# Patient Record
Sex: Female | Born: 1980 | Race: White | Hispanic: No | Marital: Married | Smoking: Former smoker
Health system: Southern US, Community
[De-identification: ages and names within clinical notes are randomized; demographics above are authoritative.]

## PROBLEM LIST (undated history)

## (undated) DIAGNOSIS — D649 Anemia, unspecified: Secondary | ICD-10-CM

## (undated) DIAGNOSIS — F419 Anxiety disorder, unspecified: Secondary | ICD-10-CM

## (undated) HISTORY — PX: TONSILLECTOMY: SUR1361

---

## 2014-01-02 ENCOUNTER — Other Ambulatory Visit (HOSPITAL_COMMUNITY)
Admission: RE | Admit: 2014-01-02 | Discharge: 2014-01-02 | Disposition: A | Discharge status: Home / Self Care | Payer: BC Managed Care – PPO | Source: Ambulatory Visit | Attending: Family Medicine | Admitting: Family Medicine

## 2014-01-02 DIAGNOSIS — Z113 Encounter for screening for infections with a predominantly sexual mode of transmission: Secondary | ICD-10-CM | POA: Insufficient documentation

## 2014-01-02 DIAGNOSIS — Z01419 Encounter for gynecological examination (general) (routine) without abnormal findings: Secondary | ICD-10-CM | POA: Insufficient documentation

## 2015-12-10 ENCOUNTER — Other Ambulatory Visit: Payer: Self-pay | Admitting: Family Medicine

## 2015-12-10 DIAGNOSIS — N63 Unspecified lump in unspecified breast: Secondary | ICD-10-CM

## 2015-12-23 ENCOUNTER — Other Ambulatory Visit: Payer: Self-pay

## 2017-01-19 ENCOUNTER — Other Ambulatory Visit: Payer: Self-pay | Admitting: Family Medicine

## 2017-01-19 ENCOUNTER — Other Ambulatory Visit (HOSPITAL_COMMUNITY)
Admission: RE | Admit: 2017-01-19 | Discharge: 2017-01-19 | Disposition: A | Discharge status: Home / Self Care | Payer: BC Managed Care – PPO | Source: Ambulatory Visit | Attending: Family Medicine | Admitting: Family Medicine

## 2017-01-19 DIAGNOSIS — Z124 Encounter for screening for malignant neoplasm of cervix: Secondary | ICD-10-CM | POA: Diagnosis not present

## 2017-01-20 LAB — CYTOLOGY - PAP
ADEQUACY: ABSENT
DIAGNOSIS: NEGATIVE
HPV: NOT DETECTED

## 2018-09-24 ENCOUNTER — Other Ambulatory Visit: Payer: Self-pay | Admitting: *Deleted

## 2018-09-24 DIAGNOSIS — Z20822 Contact with and (suspected) exposure to covid-19: Secondary | ICD-10-CM

## 2018-09-27 NOTE — Addendum Note (Signed)
Addended by: Alija Riano M on: 09/27/2018 03:53 PM   Modules accepted: Orders  

## 2020-10-14 ENCOUNTER — Other Ambulatory Visit: Payer: Self-pay | Admitting: Home Modifications

## 2020-10-14 ENCOUNTER — Ambulatory Visit
Admission: RE | Admit: 2020-10-14 | Discharge: 2020-10-14 | Disposition: A | Discharge status: Home / Self Care | Payer: BC Managed Care – PPO | Source: Ambulatory Visit | Attending: Home Modifications | Admitting: Home Modifications

## 2020-10-14 ENCOUNTER — Other Ambulatory Visit: Payer: Self-pay

## 2020-10-14 DIAGNOSIS — M546 Pain in thoracic spine: Secondary | ICD-10-CM

## 2021-04-16 ENCOUNTER — Other Ambulatory Visit: Payer: Self-pay | Admitting: Family Medicine

## 2021-04-16 DIAGNOSIS — R109 Unspecified abdominal pain: Secondary | ICD-10-CM

## 2021-04-28 ENCOUNTER — Ambulatory Visit
Admission: RE | Admit: 2021-04-28 | Discharge: 2021-04-28 | Disposition: A | Discharge status: Home / Self Care | Payer: BC Managed Care – PPO | Source: Ambulatory Visit | Attending: Family Medicine | Admitting: Family Medicine

## 2021-04-28 DIAGNOSIS — R109 Unspecified abdominal pain: Secondary | ICD-10-CM

## 2021-09-28 LAB — OB RESULTS CONSOLE HEPATITIS B SURFACE ANTIGEN: Hepatitis B Surface Ag: NEGATIVE

## 2021-09-28 LAB — OB RESULTS CONSOLE RUBELLA ANTIBODY, IGM: Rubella: IMMUNE

## 2021-09-28 LAB — HEPATITIS C ANTIBODY: HCV Ab: NEGATIVE

## 2021-09-28 LAB — OB RESULTS CONSOLE HIV ANTIBODY (ROUTINE TESTING): HIV: NONREACTIVE

## 2021-09-30 LAB — OB RESULTS CONSOLE GC/CHLAMYDIA
Chlamydia: NEGATIVE
Neisseria Gonorrhea: NEGATIVE

## 2022-01-31 ENCOUNTER — Other Ambulatory Visit: Payer: Self-pay

## 2022-01-31 ENCOUNTER — Inpatient Hospital Stay (HOSPITAL_COMMUNITY)
Admission: AD | Admit: 2022-01-31 | Discharge: 2022-02-05 | DRG: 833 | Disposition: A | Discharge status: Home / Self Care | Payer: BC Managed Care – PPO | Attending: Obstetrics and Gynecology | Admitting: Obstetrics and Gynecology

## 2022-01-31 ENCOUNTER — Encounter (HOSPITAL_COMMUNITY): Payer: Self-pay | Admitting: *Deleted

## 2022-01-31 DIAGNOSIS — Z3A30 30 weeks gestation of pregnancy: Secondary | ICD-10-CM

## 2022-01-31 DIAGNOSIS — O1403 Mild to moderate pre-eclampsia, third trimester: Principal | ICD-10-CM | POA: Diagnosis not present

## 2022-01-31 DIAGNOSIS — O133 Gestational [pregnancy-induced] hypertension without significant proteinuria, third trimester: Secondary | ICD-10-CM

## 2022-01-31 DIAGNOSIS — Z87891 Personal history of nicotine dependence: Secondary | ICD-10-CM

## 2022-01-31 DIAGNOSIS — O99283 Endocrine, nutritional and metabolic diseases complicating pregnancy, third trimester: Secondary | ICD-10-CM | POA: Diagnosis present

## 2022-01-31 DIAGNOSIS — O1404 Mild to moderate pre-eclampsia, complicating childbirth: Principal | ICD-10-CM | POA: Diagnosis present

## 2022-01-31 DIAGNOSIS — E876 Hypokalemia: Secondary | ICD-10-CM | POA: Diagnosis present

## 2022-01-31 DIAGNOSIS — O09513 Supervision of elderly primigravida, third trimester: Secondary | ICD-10-CM

## 2022-01-31 HISTORY — DX: Anxiety disorder, unspecified: F41.9

## 2022-01-31 HISTORY — DX: Anemia, unspecified: D64.9

## 2022-01-31 LAB — COMPREHENSIVE METABOLIC PANEL
ALT: 15 U/L (ref 0–44)
AST: 29 U/L (ref 15–41)
Albumin: 2.5 g/dL — ABNORMAL LOW (ref 3.5–5.0)
Alkaline Phosphatase: 76 U/L (ref 38–126)
Anion gap: 14 (ref 5–15)
BUN: <5 mg/dL — ABNORMAL LOW (ref 6–20)
CO2: 26 mmol/L (ref 22–32)
Calcium: 8.9 mg/dL (ref 8.9–10.3)
Chloride: 99 mmol/L (ref 98–111)
Creatinine, Ser: 0.59 mg/dL (ref 0.44–1.00)
GFR, Estimated: >60 mL/min (ref >60)
Glucose, Bld: 151 mg/dL — ABNORMAL HIGH (ref 70–99)
Potassium: 2.2 mmol/L — CL (ref 3.5–5.1)
Sodium: 139 mmol/L (ref 135–145)
Total Bilirubin: 0.4 mg/dL (ref 0.3–1.2)
Total Protein: 5.5 g/dL — ABNORMAL LOW (ref 6.5–8.1)

## 2022-01-31 LAB — PROTEIN / CREATININE RATIO, URINE
Creatinine, Urine: 29 mg/dL
Protein Creatinine Ratio: 0.24 mg/mg{Cre} — ABNORMAL HIGH (ref 0.00–0.15)
Total Protein, Urine: 7 mg/dL

## 2022-01-31 LAB — MAGNESIUM: Magnesium: 1.5 mg/dL — ABNORMAL LOW (ref 1.7–2.4)

## 2022-01-31 LAB — CBC
HCT: 29.2 % — ABNORMAL LOW (ref 36.0–46.0)
Hemoglobin: 10.5 g/dL — ABNORMAL LOW (ref 12.0–15.0)
MCH: 30.3 pg (ref 26.0–34.0)
MCHC: 36 g/dL (ref 30.0–36.0)
MCV: 84.4 fL (ref 80.0–100.0)
Platelets: 249 10*3/uL (ref 150–400)
RBC: 3.46 MIL/uL — ABNORMAL LOW (ref 3.87–5.11)
RDW: 14.2 % (ref 11.5–15.5)
WBC: 8.5 10*3/uL (ref 4.0–10.5)
nRBC: 0 % (ref 0.0–0.2)

## 2022-01-31 LAB — URINALYSIS, ROUTINE W REFLEX MICROSCOPIC
Bilirubin Urine: NEGATIVE
Glucose, UA: NEGATIVE mg/dL
Hgb urine dipstick: NEGATIVE
Ketones, ur: 20 mg/dL — AB
Leukocytes,Ua: NEGATIVE
Nitrite: NEGATIVE
Protein, ur: NEGATIVE mg/dL
Specific Gravity, Urine: 1.004 — ABNORMAL LOW (ref 1.005–1.030)
pH: 6 (ref 5.0–8.0)

## 2022-01-31 LAB — POTASSIUM: Potassium: 2 mmol/L — CL (ref 3.5–5.1)

## 2022-01-31 MED ORDER — VITAMIN D 25 MCG (1000 UNIT) PO TABS
3000.0000 [IU] | ORAL_TABLET | Freq: Every day | ORAL | Status: DC
  Administered 2022-02-01 – 2022-02-05 (×5): 3000 [IU] via ORAL
  Filled 2022-01-31 (×5): qty 3

## 2022-01-31 MED ORDER — ACETAMINOPHEN 325 MG PO TABS
650.0000 mg | ORAL_TABLET | ORAL | Status: DC | PRN
  Administered 2022-02-02: 650 mg via ORAL
  Filled 2022-01-31: qty 2

## 2022-01-31 MED ORDER — CITALOPRAM HYDROBROMIDE 20 MG PO TABS
20.0000 mg | ORAL_TABLET | Freq: Every day | ORAL | Status: DC
  Administered 2022-01-31 – 2022-02-04 (×5): 20 mg via ORAL
  Filled 2022-01-31 (×6): qty 1

## 2022-01-31 MED ORDER — SODIUM CHLORIDE 0.9 % IV SOLN: Freq: Once | INTRAVENOUS | Status: AC

## 2022-01-31 MED ORDER — ACETAMINOPHEN-CAFFEINE 500-65 MG PO TABS
2.0000 | ORAL_TABLET | Freq: Once | ORAL | Status: AC
  Administered 2022-01-31: 2 via ORAL
  Filled 2022-01-31: qty 2

## 2022-01-31 MED ORDER — ASPIRIN 81 MG PO TBEC
81.0000 mg | DELAYED_RELEASE_TABLET | Freq: Every day | ORAL | Status: DC
  Administered 2022-02-01 – 2022-02-05 (×5): 81 mg via ORAL
  Filled 2022-01-31 (×6): qty 1

## 2022-01-31 MED ORDER — FERROUS SULFATE 324 MG PO TBEC: 324.0000 mg | DELAYED_RELEASE_TABLET | Freq: Every day | ORAL | Status: DC

## 2022-01-31 MED ORDER — PRENATAL MULTIVITAMIN CH
1.0000 | ORAL_TABLET | Freq: Every day | ORAL | Status: DC
  Administered 2022-02-01 – 2022-02-04 (×4): 1 via ORAL
  Filled 2022-01-31 (×5): qty 1

## 2022-01-31 MED ORDER — POTASSIUM CHLORIDE 10 MEQ/100ML IV SOLN
10.0000 meq | INTRAVENOUS | Status: AC
  Administered 2022-01-31 (×2): 10 meq via INTRAVENOUS
  Filled 2022-01-31 (×2): qty 100

## 2022-01-31 MED ORDER — POTASSIUM CHLORIDE CRYS ER 20 MEQ PO TBCR
40.0000 meq | EXTENDED_RELEASE_TABLET | Freq: Once | ORAL | Status: AC
  Administered 2022-01-31: 40 meq via ORAL
  Filled 2022-01-31: qty 2

## 2022-01-31 MED ORDER — POTASSIUM CHLORIDE 10 MEQ/100ML IV SOLN
10.0000 meq | INTRAVENOUS | Status: AC
  Administered 2022-01-31 – 2022-02-01 (×2): 10 meq via INTRAVENOUS
  Filled 2022-01-31 (×2): qty 100

## 2022-01-31 MED ORDER — CALCIUM CARBONATE ANTACID 500 MG PO CHEW: 2.0000 | CHEWABLE_TABLET | ORAL | Status: DC | PRN

## 2022-01-31 MED ORDER — POTASSIUM CHLORIDE 10 MEQ/100ML IV SOLN: 10.0000 meq | INTRAVENOUS | Status: DC

## 2022-01-31 MED ORDER — MAGNESIUM SULFATE 2 GM/50ML IV SOLN
2.0000 g | Freq: Once | INTRAVENOUS | Status: AC
  Administered 2022-01-31: 2 g via INTRAVENOUS
  Filled 2022-01-31: qty 50

## 2022-01-31 NOTE — MAU Note (Signed)
Sabrina Lindsey is a 41 y.o. at Unknown here in MAU reporting: she was instructed by OB to be seen for BP evaluation.  States randomly checked BP yesterday because BP was elevated @ last office visit. Reports was elevated today while @ work and has slight H/A.  Denies visual disturbances & epigastric pain.   Endorses +FM, denies VB or LOF. LMP: N/A Onset of complaint: yesterday Pain score: 2 Vitals:   01/31/22 1538  BP: 133/86  Pulse: 95  Resp: 20  Temp: 98 F (36.7 C)  SpO2: 98%     FHT:130's Lab orders placed from triage:   UA

## 2022-01-31 NOTE — MAU Provider Note (Signed)
History     CSN: [id number]  Arrival date and time: 01/31/22 1455   Event Date/Time   First Provider Initiated Contact with Patient 01/31/22 1615      Chief Complaint  Patient presents with   BP Evaluation   HPI Sabrina Lindsey is a 41 y.o. G1P0 at [redacted]w[redacted]d who presents to MAU as advised by her [facility] Provider for evaluation of elevated blood pressures on her home cuff. Patient denies history of HTN. She reports blood pressure readings of 130s/80s or lower in the office. She denies visual disturbances, RUQ/epigastric pain, new onset swelling or weight gain.  Patient also c/o headache. Pain score is 2-3/10 on arrival to MAU. She denies history of headaches or migraines. She has not taken medication for this complaint.  Patient denies history of Hypkalemia. She denies vomiting, diarrhea, cramping, weakness and activity intolerance.  Patient receives prenatal care with [facility]. Her next appointment is tomorrow 02/01/2022  OB History     Gravida  1   Para      Term      Preterm      AB      Living         SAB      IAB      Ectopic      Multiple      Live Births              Past Medical History:  Diagnosis Date   Anemia    Anxiety     Past Surgical History:  Procedure Laterality Date   TONSILLECTOMY      Family History  Problem Relation Age of Onset   Hypertension Mother    Healthy Father    Hypertension Brother    Bell's palsy Brother     Social History   Tobacco Use   Smoking status: Former    Types: Cigarettes   Smokeless tobacco: Former  Building services engineer Use: Never used  Substance Use Topics   Alcohol use: Not Currently    Comment: Occasional   Drug use: Never    Allergies: No Known Allergies  Medications Prior to Admission  Medication Sig Dispense Refill Last Dose   aspirin EC 81 MG tablet Take 81 mg by mouth daily. Swallow whole.   01/31/2022 at 0715   cholecalciferol (VITAMIN D3) 25 MCG (1000 UNIT) tablet Take 3,000 Units by  mouth daily.   01/31/2022 at 0715   citalopram (CELEXA) 20 MG tablet Take 20 mg by mouth daily.   01/30/2022 at 200   Doxylamine-Pyridoxine 10-10 MG TBEC Take by mouth.   01/30/2022 at 2200   ferrous sulfate 324 MG TBEC Take 324 mg by mouth.   01/31/2022 at 0715   Prenatal Vit-Fe Fumarate-FA (MULTIVITAMIN-PRENATAL) 27-0.8 MG TABS tablet Take 1 tablet by mouth daily at 12 noon.   01/31/2022 at 0715    Review of Systems  Neurological:  Positive for headaches.       Pain score 2-3/10 on arrival to MAU  All other systems reviewed and are negative.  Physical Exam   Blood pressure (!) 140/87, pulse 90, temperature 98 F (36.7 C), temperature source Oral, resp. rate 20, height 5\' 3"  (1.6 m), weight 79 kg, SpO2 97 %.  Physical Exam Vitals and nursing note reviewed. Exam conducted with a chaperone present.  Constitutional:      Appearance: Normal appearance. She is not ill-appearing.  Cardiovascular:     Rate and Rhythm: Normal rate and regular rhythm.  Pulses: Normal pulses.     Heart sounds: Normal heart sounds.  Pulmonary:     Effort: Pulmonary effort is normal.     Breath sounds: Normal breath sounds.  Abdominal:     Comments: Gravid  Skin:    Capillary Refill: Capillary refill takes less than 2 seconds.  Neurological:     Mental Status: She is alert and oriented to person, place, and time.  Psychiatric:        Mood and Affect: Mood normal.        Behavior: Behavior normal.        Thought Content: Thought content normal.        Judgment: Judgment normal.     MAU Course  Procedures  MDM  --Reactive tracing: baseline 135, mod var, + accels, no decels --Toco: rare UI --Initial K+ critically low on CMET. Patient without associated complaints. Discussed with Dr. Nehemiah Settle. Will plan for PO and IV repletion while also having K+ redrawn. --QT prolongation on ECG, reviewed by Dr. Nehemiah Settle --Repeat K+ remains critically low. Discussed plan for 23 hour obs on OBSC to allow for IV  Potassium and  Magnesium infusions. Patient tearful but agrees with recommendation. Discussed with Dr. Mancel Bale, who agrees with recommendation  Patient Vitals for the past 24 hrs:  BP Temp Temp src Pulse Resp SpO2 Height Weight  01/31/22 1620 (!) 140/87 -- -- 90 -- -- -- --  01/31/22 1605 -- -- -- -- -- 98 % -- --  01/31/22 1600 122/86 -- -- 89 -- 97 % -- --  01/31/22 1553 135/88 -- -- 93 -- -- -- --  01/31/22 1540 133/86 -- -- 90 -- 98 % -- --  01/31/22 1538 133/86 98 F (36.7 C) Oral 95 20 98 % -- --  01/31/22 1520 -- -- -- -- -- -- 5\' 3"  (1.6 m) 79 kg   Results for orders placed or performed during the hospital encounter of 01/31/22 (from the past 24 hour(s))  Protein / creatinine ratio, urine     Status: Abnormal   Collection Time: 01/31/22  3:24 PM  Result Value Ref Range   Creatinine, Urine 29 mg/dL   Total Protein, Urine 7 mg/dL   Protein Creatinine Ratio 0.24 (H) 0.00 - 0.15 mg/mg[Cre]  CBC     Status: Abnormal   Collection Time: 01/31/22  3:25 PM  Result Value Ref Range   WBC 8.5 4.0 - 10.5 K/uL   RBC 3.46 (L) 3.87 - 5.11 MIL/uL   Hemoglobin 10.5 (L) 12.0 - 15.0 g/dL   HCT 29.2 (L) 36.0 - 46.0 %   MCV 84.4 80.0 - 100.0 fL   MCH 30.3 26.0 - 34.0 pg   MCHC 36.0 30.0 - 36.0 g/dL   RDW 14.2 11.5 - 15.5 %   Platelets 249 150 - 400 K/uL   nRBC 0.0 0.0 - 0.2 %  Comprehensive metabolic panel     Status: Abnormal   Collection Time: 01/31/22  3:25 PM  Result Value Ref Range   Sodium 139 135 - 145 mmol/L   Potassium 2.2 (LL) 3.5 - 5.1 mmol/L   Chloride 99 98 - 111 mmol/L   CO2 26 22 - 32 mmol/L   Glucose, Bld 151 (H) 70 - 99 mg/dL   BUN <5 (L) 6 - 20 mg/dL   Creatinine, Ser 0.59 0.44 - 1.00 mg/dL   Calcium 8.9 8.9 - 10.3 mg/dL   Total Protein 5.5 (L) 6.5 - 8.1 g/dL   Albumin 2.5 (L) 3.5 -  5.0 g/dL   AST 29 15 - 41 U/L   ALT 15 0 - 44 U/L   Alkaline Phosphatase 76 38 - 126 U/L   Total Bilirubin 0.4 0.3 - 1.2 mg/dL   GFR, Estimated >60 >60 mL/min   Anion gap 14 5 - 15   Urinalysis, Routine w reflex microscopic Urine, Clean Catch     Status: Abnormal   Collection Time: 01/31/22  3:26 PM  Result Value Ref Range   Color, Urine YELLOW YELLOW   APPearance HAZY (A) CLEAR   Specific Gravity, Urine 1.004 (L) 1.005 - 1.030   pH 6.0 5.0 - 8.0   Glucose, UA NEGATIVE NEGATIVE mg/dL   Hgb urine dipstick NEGATIVE NEGATIVE   Bilirubin Urine NEGATIVE NEGATIVE   Ketones, ur 20 (A) NEGATIVE mg/dL   Protein, ur NEGATIVE NEGATIVE mg/dL   Nitrite NEGATIVE NEGATIVE   Leukocytes,Ua NEGATIVE NEGATIVE  Potassium     Status: Abnormal   Collection Time: 01/31/22  4:28 PM  Result Value Ref Range   Potassium 2.0 (LL) 3.5 - 5.1 mmol/L  Magnesium     Status: Abnormal   Collection Time: 01/31/22  4:28 PM  Result Value Ref Range   Magnesium 1.5 (L) 1.7 - 2.4 mg/dL   Meds ordered this encounter  Medications   acetaminophen-caffeine (EXCEDRIN TENSION HEADACHE) 500-65 MG per tablet 2 tablet   potassium chloride 10 mEq in 100 mL IVPB   potassium chloride SA (KLOR-CON M) CR tablet 40 mEq   0.9 %  sodium chloride infusion   potassium chloride 10 mEq in 100 mL IVPB   magnesium sulfate IVPB 2 g 50 mL    Magnesium level 1.5 in MAU. NOT being given for Preeclampsia   0.9 %  sodium chloride infusion    Assessment and Plan  --41 y.o. G1P0 at [redacted]w[redacted]d  --Reactive tracing --Hypokalemia (2.2, 2.0 on recheck) --Elevated BP x 1, PEC labs WNL --Headache resolved with PO medication --Per Dr. Mancel Bale, transfer to [facility] for Potassium and Magnesium repletion  Darlina Rumpf, [place], MSN, CNM 01/31/2022, 6:27 PM

## 2022-01-31 NOTE — H&P (Addendum)
Sabrina Lindsey is a 41 y.o. female presenting for elevated BP at home.  Denies HA, visual changes or abdominal pain. OB History     Gravida  1   Para      Term      Preterm      AB      Living         SAB      IAB      Ectopic      Multiple      Live Births             Past Medical History:  Diagnosis Date   Anemia    Anxiety    Past Surgical History:  Procedure Laterality Date   TONSILLECTOMY     Family History: family history includes Bell's palsy in her brother; Healthy in her father; Hypertension in her brother and mother. Social History:  reports that she has quit smoking. Her smoking use included cigarettes. She has quit using smokeless tobacco. She reports that she does not currently use alcohol. She reports that she does not use drugs.     Maternal Diabetes: No Genetic Screening: Normal Maternal Ultrasounds/Referrals: Normal Fetal Ultrasounds or other Referrals:  None Maternal Substance Abuse:  No Significant Maternal Medications:  Meds include: Other: celexa Significant Maternal Lab Results:  None Number of Prenatal Visits:greater than 3 verified prenatal visits Other Comments:  None  Review of Systems Denies F/C/N/V/D History   Blood pressure (!) 143/91, pulse 83, temperature 97.8 F (36.6 C), temperature source Oral, resp. rate 19, height 5\' 3"  (1.6 m), weight 79 kg, SpO2 98 %. Exam Physical Exam  Prenatal labs: ABO, Rh:  O + Antibody:  Neg Rubella:  Immune RPR:   NR HBsAg:   Neg HIV:   NR GBS:    Cat 1 tracing  Assessment/Plan: G1 P0 at 30 4/7 wks presenting with mildly elevated BP and hypokalemia and hypomagnesemia.  She likely meets criteria for [facility] given her elevated BP at home.  PCR 0.24.   Will replete potassium and magnesium Restart celexa  Low lying placenta earlier in the pregnancy that has since resolved.   Delice Lesch 01/31/2022, 6:53 PM

## 2022-01-31 NOTE — Progress Notes (Signed)
Date and time results received: 01/31/22 1730 (use smartphrase ".now" to insert current time)  Test: Potassium Critical Value: 2.0  Name of Provider Notified: S. Jeronimo Greaves, CNM  Orders Received? Or Actions Taken?: Orders Received - See Orders for details

## 2022-01-31 NOTE — Progress Notes (Signed)
Date and time results received: 01/31/22 1605 (use smartphrase ".now" to insert current time)  Test: Potassium Critical Value: 2.2  Name of Provider Notified: S. Jeronimo Greaves, CNM  Orders Received? Or Actions Taken?:     waiting for further orders

## 2022-02-01 DIAGNOSIS — O99213 Obesity complicating pregnancy, third trimester: Secondary | ICD-10-CM | POA: Diagnosis not present

## 2022-02-01 DIAGNOSIS — E876 Hypokalemia: Secondary | ICD-10-CM | POA: Diagnosis not present

## 2022-02-01 DIAGNOSIS — Z87891 Personal history of nicotine dependence: Secondary | ICD-10-CM | POA: Diagnosis not present

## 2022-02-01 DIAGNOSIS — Z3A30 30 weeks gestation of pregnancy: Secondary | ICD-10-CM | POA: Diagnosis not present

## 2022-02-01 DIAGNOSIS — O133 Gestational [pregnancy-induced] hypertension without significant proteinuria, third trimester: Secondary | ICD-10-CM | POA: Diagnosis not present

## 2022-02-01 DIAGNOSIS — O1403 Mild to moderate pre-eclampsia, third trimester: Secondary | ICD-10-CM | POA: Diagnosis present

## 2022-02-01 DIAGNOSIS — O09513 Supervision of elderly primigravida, third trimester: Secondary | ICD-10-CM | POA: Diagnosis not present

## 2022-02-01 DIAGNOSIS — R030 Elevated blood-pressure reading, without diagnosis of hypertension: Secondary | ICD-10-CM | POA: Diagnosis present

## 2022-02-01 DIAGNOSIS — O99283 Endocrine, nutritional and metabolic diseases complicating pregnancy, third trimester: Secondary | ICD-10-CM | POA: Diagnosis not present

## 2022-02-01 LAB — TYPE AND SCREEN
ABO/RH(D): O POS
Antibody Screen: NEGATIVE

## 2022-02-01 LAB — POTASSIUM
Potassium: 2.5 mmol/L — CL (ref 3.5–5.1)
Potassium: 2.5 mmol/L — CL (ref 3.5–5.1)

## 2022-02-01 LAB — MAGNESIUM: Magnesium: 1.8 mg/dL (ref 1.7–2.4)

## 2022-02-01 MED ORDER — POTASSIUM CHLORIDE CRYS ER 20 MEQ PO TBCR
40.0000 meq | EXTENDED_RELEASE_TABLET | Freq: Two times a day (BID) | ORAL | Status: DC
  Administered 2022-02-01: 40 meq via ORAL
  Filled 2022-02-01: qty 2

## 2022-02-01 MED ORDER — NIFEDIPINE ER OSMOTIC RELEASE 60 MG PO TB24
60.0000 mg | ORAL_TABLET | Freq: Every day | ORAL | Status: DC
  Administered 2022-02-02: 60 mg via ORAL
  Filled 2022-02-01: qty 1

## 2022-02-01 MED ORDER — POTASSIUM CHLORIDE 10 MEQ/100ML IV SOLN
10.0000 meq | INTRAVENOUS | Status: AC
  Administered 2022-02-02 (×4): 10 meq via INTRAVENOUS
  Filled 2022-02-01 (×4): qty 100

## 2022-02-01 MED ORDER — NIFEDIPINE ER OSMOTIC RELEASE 30 MG PO TB24
30.0000 mg | ORAL_TABLET | Freq: Every day | ORAL | Status: DC
  Administered 2022-02-01: 30 mg via ORAL
  Filled 2022-02-01: qty 1

## 2022-02-01 MED ORDER — POTASSIUM CHLORIDE 10 MEQ/100ML IV SOLN
10.0000 meq | INTRAVENOUS | Status: AC
  Administered 2022-02-01 (×3): 10 meq via INTRAVENOUS
  Filled 2022-02-01 (×3): qty 100

## 2022-02-01 MED ORDER — NIFEDIPINE ER OSMOTIC RELEASE 30 MG PO TB24
30.0000 mg | ORAL_TABLET | Freq: Once | ORAL | Status: AC
  Administered 2022-02-01: 30 mg via ORAL
  Filled 2022-02-01: qty 1

## 2022-02-01 MED ORDER — POTASSIUM CHLORIDE CRYS ER 20 MEQ PO TBCR
40.0000 meq | EXTENDED_RELEASE_TABLET | Freq: Three times a day (TID) | ORAL | Status: DC
  Administered 2022-02-01 – 2022-02-05 (×11): 40 meq via ORAL
  Filled 2022-02-01 (×11): qty 2

## 2022-02-01 NOTE — Progress Notes (Signed)
RN received call from Dr. Alesia Richards regarding latest BP of 157/96. New orders/ changes to orders received. Additional Procardia 30mg  dose to be given now. Patient now inpatient status.

## 2022-02-01 NOTE — Progress Notes (Addendum)
Sabrina Lindsey is a 41 y.o. G1P0 at [redacted]w[redacted]d admitted for headache and found to have gestational hypertension, hypokalemia and hypomagnesemia,   Subjective: Patient reports doing better.  Her headache has gone away.  She denies any chest pain, shortness of breath, nausea or vomiting.   Objective: BP (!) 145/93 (BP Location: Right Arm)   Pulse 77   Temp 97.8 F (36.6 C) (Oral)   Resp 16   Ht 5\' 3"  (1.6 m)   Wt 79 kg   SpO2 97%   BMI 30.86 kg/m  I/O last 3 completed shifts: In: 589.5 [I.V.:589.5] Out: 2150 [Urine:2150] Total I/O In: -  Out: 500 [Urine:500] General: Appears well, no acute distress. CVS: s1, s2, regular rate and rhythm. Pulmonary: Clear to auscultation bilaterally. Abdomen: Soft, non tender, gravid. Extremities: Warm and well perfused, no edema, no calf tenderness. 1+ patellar reflex bilaterally.  FHT:  FHR: 140 bpm, variability: moderate,  accelerations:  Present,  decelerations:  Absent UC:   none SVE:    deferred.    Labs: Lab Results  Component Value Date   WBC 8.5 01/31/2022   HGB 10.5 (L) 01/31/2022   HCT 29.2 (L) 01/31/2022   MCV 84.4 01/31/2022   PLT 249 01/31/2022     Assessment / Plan:  41 y.o. G1P0 at [redacted]w[redacted]d admitted for headache and found to have gestational hypertension, hypokalemia and hypomagnesemia,  - Resolved hypomagnesemia after repletion.  - Will give oral and IV potassium repletion and check potassium level  after repletion.  - Procardia XL started for elevated blood pressures.   Archie Endo, MD 02/01/2022, 11:11 AM

## 2022-02-02 ENCOUNTER — Inpatient Hospital Stay (HOSPITAL_BASED_OUTPATIENT_CLINIC_OR_DEPARTMENT_OTHER): Payer: BC Managed Care – PPO

## 2022-02-02 DIAGNOSIS — O133 Gestational [pregnancy-induced] hypertension without significant proteinuria, third trimester: Secondary | ICD-10-CM

## 2022-02-02 DIAGNOSIS — Z3A30 30 weeks gestation of pregnancy: Secondary | ICD-10-CM

## 2022-02-02 DIAGNOSIS — O99213 Obesity complicating pregnancy, third trimester: Secondary | ICD-10-CM

## 2022-02-02 DIAGNOSIS — O09513 Supervision of elderly primigravida, third trimester: Secondary | ICD-10-CM

## 2022-02-02 DIAGNOSIS — E669 Obesity, unspecified: Secondary | ICD-10-CM

## 2022-02-02 DIAGNOSIS — O99283 Endocrine, nutritional and metabolic diseases complicating pregnancy, third trimester: Secondary | ICD-10-CM

## 2022-02-02 DIAGNOSIS — E876 Hypokalemia: Secondary | ICD-10-CM

## 2022-02-02 LAB — CBC
HCT: 32.6 % — ABNORMAL LOW (ref 36.0–46.0)
Hemoglobin: 11.4 g/dL — ABNORMAL LOW (ref 12.0–15.0)
MCH: 30.4 pg (ref 26.0–34.0)
MCHC: 35 g/dL (ref 30.0–36.0)
MCV: 86.9 fL (ref 80.0–100.0)
Platelets: 263 10*3/uL (ref 150–400)
RBC: 3.75 MIL/uL — ABNORMAL LOW (ref 3.87–5.11)
RDW: 14.7 % (ref 11.5–15.5)
WBC: 10 10*3/uL (ref 4.0–10.5)
nRBC: 0 % (ref 0.0–0.2)

## 2022-02-02 LAB — COMPREHENSIVE METABOLIC PANEL
ALT: 13 U/L (ref 0–44)
AST: 35 U/L (ref 15–41)
Albumin: 2.6 g/dL — ABNORMAL LOW (ref 3.5–5.0)
Alkaline Phosphatase: 82 U/L (ref 38–126)
Anion gap: 14 (ref 5–15)
BUN: <5 mg/dL — ABNORMAL LOW (ref 6–20)
CO2: 24 mmol/L (ref 22–32)
Calcium: 8.4 mg/dL — ABNORMAL LOW (ref 8.9–10.3)
Chloride: 102 mmol/L (ref 98–111)
Creatinine, Ser: 0.52 mg/dL (ref 0.44–1.00)
GFR, Estimated: >60 mL/min (ref >60)
Glucose, Bld: 99 mg/dL (ref 70–99)
Potassium: 3.4 mmol/L — ABNORMAL LOW (ref 3.5–5.1)
Sodium: 140 mmol/L (ref 135–145)
Total Bilirubin: 1.1 mg/dL (ref 0.3–1.2)
Total Protein: 5.6 g/dL — ABNORMAL LOW (ref 6.5–8.1)

## 2022-02-02 LAB — PROTEIN / CREATININE RATIO, URINE
Creatinine, Urine: 91 mg/dL
Protein Creatinine Ratio: 0.3 mg/mg{Cre} — ABNORMAL HIGH (ref 0.00–0.15)
Total Protein, Urine: 27 mg/dL

## 2022-02-02 LAB — GROUP B STREP BY PCR: Group B strep by PCR: NEGATIVE

## 2022-02-02 MED ORDER — DOXYLAMINE-PYRIDOXINE 10-10 MG PO TBEC: DELAYED_RELEASE_TABLET | Freq: Every day | ORAL | Status: DC

## 2022-02-02 MED ORDER — VITAMIN B-6 25 MG PO TABS
25.0000 mg | ORAL_TABLET | Freq: Every day | ORAL | Status: DC
  Administered 2022-02-02 – 2022-02-04 (×3): 25 mg via ORAL
  Filled 2022-02-02 (×3): qty 1

## 2022-02-02 MED ORDER — DOXYLAMINE SUCCINATE (SLEEP) 25 MG PO TABS
25.0000 mg | ORAL_TABLET | Freq: Every day | ORAL | Status: DC
  Administered 2022-02-02 – 2022-02-04 (×3): 25 mg via ORAL
  Filled 2022-02-02 (×3): qty 1

## 2022-02-02 NOTE — Consult Note (Addendum)
MFM Consult Note Patient Name: Sabrina Lindsey  Patient MRN:   [id number]  Referring provider: Osborn Coho, MD / Verlan Friends, MD Reason for Consult: Elevated blood pressure in pregnancy and headache  HPI: Sabrina Lindsey is a 41 y.o. G1P0 at [redacted]w[redacted]d admitted for elevated blood pressure in pregnancy.  The patient presented to the MAU for a headache and was found to have elevated blood pressures.  She did not have any severe range blood pressures but they were very close (upper 150s over 90s).  She was also found to have hypomagnesemia and hypokalemia.  She reports that she has not had nausea vomiting until she was admitted.  She has had electrolyte replacement.  She was started on Procardia XL for elevated blood pressure.  I discussed the diagnosis of gestational hypertension versus preeclampsia.  Also discussed the clinical course and management of each condition.  I discussed that we could potentially discontinue her antihypertensive medications, perform a 24-hour urine protein and continue to assess her blood pressure without medication. I discussed that if she has a persistent headache despite medication then she likely has preeclampsia.  Also discussed that we could consider discontinuing her antihypertensive medications to see if her blood pressure is severe.  If her blood pressure is severe then she would rule in for preeclampsia and then we could treat her blood pressure with the goal of getting her to 34 weeks.  Also discussed indications for delivery including worsening maternal or fetal status.   Review of Systems: A review of systems was performed and was negative except per HPI   Vitals and Physical Exam See intake sheet for vitals Sitting comfortably on the sonogram table Nonlabored breathing Normal rate and rhythm Abdomen is nontender  Genetic testing: Normal and IPS per the patient  Sonographic findings Single intrauterine pregnancy. Observed fetal cardiac activity. Cephalic  presentation. Fetal anatomy that was well seen appears normal without evidence of soft markers. Not all fetal structures were well seen due to a technically diffcult exam and the anatomic survey remains incomplete with limited views of certain structures (see detailed report). Fetal biometry shows the estimated fetal weight at the 31 percentile.  Amniotic fluid volume: Within normal limits. Placenta: Posterior. Cervix: Not visualized (advanced GA >24wks). Adnexa: No abnormality visualized.  Assessment -Gestational hypertension versus preeclampsia Plan -Continue to monitor blood pressure, labs and symptoms for preeclampsia -Recommend discontinuing/holding Procardia to see if she has severe range blood pressure without antihypertensive medication. This will allow [place] to clarify her diagnosis. If she has consistent severe range BP, then we can start antihypertensive medication and treat her as preeclampsia with severe features.  -Consider 24-hour urine protein to help clarify the diagnosis of preeclampsia versus gestational hypertension -Continue at least daily fetal monitoring.  Recommend fetal monitoring while the patient has any concerning symptoms or if blood pressure is severe range. -Continue inpatient care, will continue to reassess on a daily basis. -Magnesium and BMZ would be indicated if she has severe range BP and rules in for preeclampsia with severe features or has signs of fetal compromise.  -Detailed ultrasound ordered as well.  Pending.   I spent 60 minutes reviewing the patients chart, including labs and images as well as counseling the patient about her medical conditions.  Braxton Feathers  MFM, [place]   CODE: [id number]  02/02/2022  5:46 PM

## 2022-02-02 NOTE — Progress Notes (Signed)
Patient ID: Sabrina Lindsey, female   DOB: 11/28/1980, 41 y.o.   MRN: [id number] Sabrina Lindsey is a 41 y.o. G1P0 at [redacted]w[redacted]d  Hospital Day No: 3  Subjective: No HA now but had one earlier in the day relieved with tylenol.  Denies abdominal pain or visual changes.  Reports + FM, no LOF and no VB or ctxs.  Objective: BP (!) 150/91 (BP Location: Right Arm)   Pulse 97   Temp 98.5 F (36.9 C) (Oral)   Resp 19   Ht 5\' 3"  (1.6 m)   Wt 79 kg   SpO2 96%   BMI 30.86 kg/m  I/O last 3 completed shifts: In: 938.4 [I.V.:617.6; IV Piggyback:320.7] Out: 6250 [Urine:5950; Emesis/NG output:300] Total I/O In: 906.4 [P.O.:540; IV Piggyback:366.4] Out: 450 [Emesis/NG output:450]  Physical Exam:  Gen: alert Chest/Lungs: cta bilaterally  Heart/Pulse: RRR  Abdomen: soft, gravid, nontender Uterine fundus: soft, nontender Skin & Color: warm and dry  Neurological: AOx3, DTRs 3+ EXT: negative Homan's b/l, edema trace  FHT:  FHR: 130 bpm, variability: moderate,  accelerations:  Present,  decelerations:  Absent but LOC noted UC:   none SVE:    deferred  Labs: Lab Results  Component Value Date   WBC 10.0 02/02/2022   HGB 11.4 (L) 02/02/2022   HCT 32.6 (L) 02/02/2022   MCV 86.9 02/02/2022   PLT 263 02/02/2022    Assessment and Plan: has Hypokalemia on their problem list. Hypokalemia - resolving.  Continue 51meq po q8hrs.  Pt would prefer no more IV runs as it burns.  Repeat labs in AM.  Hypomagnesemia - resolved  GHTN - suspect developing pre-eclampsia as pt's BPs have crept up but not meeting severe criteria.  Pt had been started on procardia XL 30mg  and then increased to 60mg .  Currently without neuro sxs.  Will repeat urine pcr.  MFM consult ordered about recommendations for BMZ and inpt vs outpt mgmt.  Pt is disappointed she is still in the hospital.  DVT prophylaxis - SCDs  Fetal status - cat 1 and reassuring.  Nausea and vomiting - restarted doxylamine/pyridoxine nightly.  Pt thinks the  nausea is from the IV potassium.  Amylase and lipase added on.  1900 MFM contacted me with recs - hold procardia unless BPs reach severe range.  Order 24hr urine.  Once results return can better determine inpt vs outpt mgmt as pt will have more BP readings as well.  Mg if develops pre- eclampsia with severe features.  I updated MFM with PCR results from today.  Pt now meets criteria for pre-eclampsia without severe features.  24hr urine in progress.  Dr. Vladimir Creeks of MFM will write a note.  Delice Lesch 02/02/2022, 4:10 PM

## 2022-02-03 LAB — COMPREHENSIVE METABOLIC PANEL
ALT: 12 U/L (ref 0–44)
AST: 20 U/L (ref 15–41)
Albumin: 2.3 g/dL — ABNORMAL LOW (ref 3.5–5.0)
Alkaline Phosphatase: 71 U/L (ref 38–126)
Anion gap: 11 (ref 5–15)
BUN: <5 mg/dL — ABNORMAL LOW (ref 6–20)
CO2: 25 mmol/L (ref 22–32)
Calcium: 8.6 mg/dL — ABNORMAL LOW (ref 8.9–10.3)
Chloride: 104 mmol/L (ref 98–111)
Creatinine, Ser: 0.47 mg/dL (ref 0.44–1.00)
GFR, Estimated: >60 mL/min (ref >60)
Glucose, Bld: 97 mg/dL (ref 70–99)
Potassium: 2.7 mmol/L — CL (ref 3.5–5.1)
Sodium: 140 mmol/L (ref 135–145)
Total Bilirubin: 0.4 mg/dL (ref 0.3–1.2)
Total Protein: 5.1 g/dL — ABNORMAL LOW (ref 6.5–8.1)

## 2022-02-03 LAB — URINALYSIS, ROUTINE W REFLEX MICROSCOPIC
Bilirubin Urine: NEGATIVE
Glucose, UA: 50 mg/dL — AB
Hgb urine dipstick: NEGATIVE
Ketones, ur: 5 mg/dL — AB
Leukocytes,Ua: NEGATIVE
Nitrite: NEGATIVE
Protein, ur: NEGATIVE mg/dL
Specific Gravity, Urine: 1.011 (ref 1.005–1.030)
Squamous Epithelial / HPF: >50 — ABNORMAL HIGH (ref 0–5)
pH: 6 (ref 5.0–8.0)

## 2022-02-03 LAB — CBC
HCT: 30.8 % — ABNORMAL LOW (ref 36.0–46.0)
Hemoglobin: 10.3 g/dL — ABNORMAL LOW (ref 12.0–15.0)
MCH: 29.4 pg (ref 26.0–34.0)
MCHC: 33.4 g/dL (ref 30.0–36.0)
MCV: 88 fL (ref 80.0–100.0)
Platelets: 251 10*3/uL (ref 150–400)
RBC: 3.5 MIL/uL — ABNORMAL LOW (ref 3.87–5.11)
RDW: 14.9 % (ref 11.5–15.5)
WBC: 8.1 10*3/uL (ref 4.0–10.5)
nRBC: 0 % (ref 0.0–0.2)

## 2022-02-03 LAB — PROTEIN, URINE, 24 HOUR
Collection Interval-UPROT: 24 hours
Protein, 24H Urine: 245 mg/d — ABNORMAL HIGH (ref 50–100)
Protein, Urine: 7 mg/dL
Urine Total Volume-UPROT: 3500 mL

## 2022-02-03 LAB — LIPASE, BLOOD: Lipase: 26 U/L (ref 11–51)

## 2022-02-03 LAB — AMYLASE: Amylase: 37 U/L (ref 28–100)

## 2022-02-03 LAB — MAGNESIUM: Magnesium: 1.5 mg/dL — ABNORMAL LOW (ref 1.7–2.4)

## 2022-02-03 MED ORDER — ONDANSETRON HCL 4 MG/2ML IJ SOLN
4.0000 mg | Freq: Once | INTRAMUSCULAR | Status: AC
  Administered 2022-02-03: 4 mg via INTRAVENOUS
  Filled 2022-02-03: qty 2

## 2022-02-03 MED ORDER — POTASSIUM CHLORIDE 10 MEQ/100ML IV SOLN
10.0000 meq | INTRAVENOUS | Status: AC
  Administered 2022-02-03 (×4): 10 meq via INTRAVENOUS
  Filled 2022-02-03 (×4): qty 100

## 2022-02-03 NOTE — Progress Notes (Signed)
24 hour urine sent to lab at 2020.

## 2022-02-03 NOTE — Plan of Care (Signed)
  Problem: Education: Goal: Knowledge of General Education information will improve Description: Including pain rating scale, medication(s)/side effects and non-pharmacologic comfort measures Outcome: Completed/Met   Problem: Health Behavior/Discharge Planning: Goal: Ability to manage health-related needs will improve Outcome: Completed/Met   Problem: Activity: Goal: Risk for activity intolerance will decrease Outcome: Completed/Met   Problem: Nutrition: Goal: Adequate nutrition will be maintained Outcome: Completed/Met   Problem: Coping: Goal: Level of anxiety will decrease Outcome: Completed/Met   Problem: Elimination: Goal: Will not experience complications related to urinary retention Outcome: Completed/Met   Problem: Education: Goal: Knowledge of disease or condition will improve Outcome: Completed/Met Goal: Knowledge of the prescribed therapeutic regimen will improve Outcome: Completed/Met

## 2022-02-03 NOTE — Plan of Care (Signed)
  Problem: Education: Goal: Knowledge of General Education information will improve Description: Including pain rating scale, medication(s)/side effects and non-pharmacologic comfort measures Outcome: Completed/Met

## 2022-02-03 NOTE — Progress Notes (Addendum)
Sabrina Lindsey is a 41 y.o. G1P0 at [redacted]w[redacted]d EGA admitted for elevated blood pressures and gestational HTN and found to have gestational hypertension, hypokalemia and hypomagnesemia  Subjective: Patient reports doing well. She denies any headaches or  vision changes. She denies any current nausea or vomiting. She reports normal fetal movement.   Objective: BP (!) 149/98 (BP Location: Right Arm)   Pulse 84   Temp 98.7 F (37.1 C) (Oral)   Resp 19   Ht 5\' 3"  (1.6 m)   Wt 79 kg   SpO2 99%   BMI 30.86 kg/m  I/O last 3 completed shifts: In: 955.3 [P.O.:540; I.V.:28.2; IV Piggyback:387.1] Out: 4800 [Urine:4250; Emesis/NG output:550] Total I/O In: 1350.1 [P.O.:960; IV Piggyback:390.1] Out: 1500 [Urine:1500]    02/03/2022    3:41 PM 02/03/2022    9:37 AM 02/03/2022    4:08 AM  Vitals with BMI  Systolic 656 812 751  Diastolic 98 96 90  Pulse 84 97 93   General: Appears well, no acute distress. CVS: s1, s2, regular rate and rhythm. Pulmonary: Clear to auscultation bilaterally. Abdomen: Soft, non tender, gravid. Extremities: Warm and well perfused, no edema, no calf tenderness. 1+ patellar reflex bilaterally.  FHT reviewed at 11 am 02/03/2022:  FHR: 120 bpm, variability: moderate,  accelerations:  Present,  decelerations:  Absent UC:   none SVE:    deferred.  Labs: Lab Results  Component Value Date   WBC 8.1 02/03/2022   HGB 10.3 (L) 02/03/2022   HCT 30.8 (L) 02/03/2022   MCV 88.0 02/03/2022   PLT 251 02/03/2022   CMP     Component Value Date/Time   NA 140 02/03/2022 0714   K 2.7 (LL) 02/03/2022 0714   CL 104 02/03/2022 0714   CO2 25 02/03/2022 0714   GLUCOSE 97 02/03/2022 0714   BUN <5 (L) 02/03/2022 0714   CREATININE 0.47 02/03/2022 0714   CALCIUM 8.6 (L) 02/03/2022 0714   PROT 5.1 (L) 02/03/2022 0714   ALBUMIN 2.3 (L) 02/03/2022 0714   AST 20 02/03/2022 0714   ALT 12 02/03/2022 0714   ALKPHOS 71 02/03/2022 0714   BILITOT 0.4 02/03/2022 0714   GFRNONAA >60 02/03/2022  0714    Amylase    Component Value Date/Time   AMYLASE 37 02/03/2022 0714    Lipase     Component Value Date/Time   LIPASE 26 02/03/2022 0714     Assessment / Plan: 41 y.o. G1P0 at 84 W EGA admitted for headache and found to have gestational hypertension, hypokalemia and hypomagnesemia,  - Recent urine PCR shows 0.30, patient now rules in for preeclampsia. - As per MFM input 24 hour urine collection was started and will finish at 8 pm tonight. MFM will give further input after obtaining these results.  - Resolved hypomagnesemia after repletion.  - With continued hypokalemia, will continue with oral and IV potassium repletion and check magnesium and potassium level  tomorrow AM.    Archie Endo, MD 02/03/2022, 11:30 AM.

## 2022-02-04 DIAGNOSIS — O1403 Mild to moderate pre-eclampsia, third trimester: Principal | ICD-10-CM | POA: Diagnosis not present

## 2022-02-04 LAB — POTASSIUM: Potassium: 3.3 mmol/L — ABNORMAL LOW (ref 3.5–5.1)

## 2022-02-04 LAB — MAGNESIUM: Magnesium: 1.3 mg/dL — ABNORMAL LOW (ref 1.7–2.4)

## 2022-02-04 MED ORDER — ORAL CARE MOUTH RINSE: 15.0000 mL | OROMUCOSAL | Status: DC | PRN

## 2022-02-04 MED ORDER — POTASSIUM CHLORIDE 10 MEQ/100ML IV SOLN
10.0000 meq | Freq: Once | INTRAVENOUS | Status: AC
  Administered 2022-02-04: 10 meq via INTRAVENOUS
  Filled 2022-02-04: qty 100

## 2022-02-04 MED ORDER — NIFEDIPINE ER OSMOTIC RELEASE 30 MG PO TB24
30.0000 mg | ORAL_TABLET | Freq: Every day | ORAL | Status: DC
  Administered 2022-02-04 – 2022-02-05 (×2): 30 mg via ORAL
  Filled 2022-02-04 (×2): qty 1

## 2022-02-04 MED ORDER — SODIUM CHLORIDE 0.9 % IV SOLN: INTRAVENOUS | Status: DC | PRN

## 2022-02-04 MED ORDER — ONDANSETRON HCL 4 MG/2ML IJ SOLN
4.0000 mg | Freq: Once | INTRAMUSCULAR | Status: AC
  Administered 2022-02-04: 4 mg via INTRAVENOUS
  Filled 2022-02-04: qty 2

## 2022-02-04 NOTE — Progress Notes (Signed)
Subjective:    Feeling well, but ready to be discharges. Discussed 24 hr urine results and new dx of pre-eclampsia. Reviewed S/S of pre-eclampsia and when to report. Pt denies neuro S/S currently. Questions answered and plan of care discussed.    Objective:    VS: BP (!) 150/99 (BP Location: Right Arm)   Pulse 91   Temp 98 F (36.7 C) (Oral)   Resp 18   Ht 5\' 3"  (1.6 m)   Wt 79 kg   SpO2 96%   BMI 30.86 kg/m     02/04/2022   11:38 AM 02/04/2022   11:13 AM 02/04/2022    8:03 AM  Vitals with BMI  Systolic 811 572 620  Diastolic 99 99 99  Pulse 94 93 91    FHR : baseline 145 / variability moderate / accelerations present / absent decelerations Toco: none per TOCO  Assessment/Plan:   41 y.o. G1P0 [redacted]w[redacted]d Pre-eclampsia w/o severe features  Admitted for headache and found to have gestational hypertension, hypokalemia and hypomagnesemia - Procardia dc'd by MFM for further evaluation    - Recent urine PCR shows 0.30, patient now rules in for preeclampsia. - 24 hr urine results 245 mg/day - Resolved hypomagnesemia after repletion.  - With continued hypokalemia, will continue with oral and IV potassium repletion and check magnesium and potassium level  tomorrow AM.      Arrie Eastern DNP, CNM 02/04/2022 9:13 AM

## 2022-02-05 LAB — COMPREHENSIVE METABOLIC PANEL
ALT: 10 U/L (ref 0–44)
AST: 19 U/L (ref 15–41)
Albumin: 2.3 g/dL — ABNORMAL LOW (ref 3.5–5.0)
Alkaline Phosphatase: 77 U/L (ref 38–126)
Anion gap: 10 (ref 5–15)
BUN: <5 mg/dL — ABNORMAL LOW (ref 6–20)
CO2: 25 mmol/L (ref 22–32)
Calcium: 8.7 mg/dL — ABNORMAL LOW (ref 8.9–10.3)
Chloride: 105 mmol/L (ref 98–111)
Creatinine, Ser: 0.44 mg/dL (ref 0.44–1.00)
GFR, Estimated: >60 mL/min (ref >60)
Glucose, Bld: 75 mg/dL (ref 70–99)
Potassium: 3.7 mmol/L (ref 3.5–5.1)
Sodium: 140 mmol/L (ref 135–145)
Total Bilirubin: 0.4 mg/dL (ref 0.3–1.2)
Total Protein: 4.9 g/dL — ABNORMAL LOW (ref 6.5–8.1)

## 2022-02-05 MED ORDER — DOXYLAMINE SUCCINATE (SLEEP) 25 MG PO TABS: 25.0000 mg | ORAL_TABLET | Freq: Every day | ORAL | 0 refills | Status: DC

## 2022-02-05 MED ORDER — ACETAMINOPHEN 325 MG PO TABS: 650.0000 mg | ORAL_TABLET | ORAL | 0 refills | Status: DC | PRN

## 2022-02-05 MED ORDER — NIFEDIPINE ER 30 MG PO TB24: 30.0000 mg | ORAL_TABLET | Freq: Every day | ORAL | 0 refills | Status: DC

## 2022-02-05 NOTE — Progress Notes (Signed)
   02/04/22 2252  Vital Signs  BP (!) 160/90  BP Location Right Arm  Patient Position (if appropriate) Supine  BP Method Automatic  Pulse Rate 82  Resp 18  Oxygen Therapy  SpO2 100 %  O2 Device Room Air

## 2022-02-05 NOTE — Progress Notes (Signed)
   02/04/22 2235  Vital Signs  BP (!) 160/107  BP Location Right Arm  Patient Position (if appropriate) Supine  BP Method Automatic  Pulse Rate 82  Pulse Rate Source Monitor  Resp 18  Temp 98.6 F (37 C)  Temp Source Oral  Oxygen Therapy  SpO2 100 %  O2 Device Room Air    Burman Foster, CNM notified of B/Ps at 2305.  No new orders.

## 2022-02-05 NOTE — Progress Notes (Signed)
Subjective:  Doing well, denies HA, visual changes, and RUQ pain. Reports feeling better since starting Procardia. Pt desires discharge today.  Objective:      02/05/2022    8:06 AM 02/05/2022    3:28 AM 02/04/2022   10:52 PM  Vitals with BMI  Systolic 379 024 097  Diastolic 92 91 90  Pulse 353 94 82     PE: Gen: alert Chest/Lungs: unlabored Abdomen: soft, gravid, nontender Skin & Color: warm and dry  Neurological: AOx3, DTRs 3+ EXT: negative for pain, tenderness, pain, or cords   NST FHR : baseline 145 / variability moderate / accelerations present / absent decelerations Toco: none per [facility] CMP     Component Value Date/Time   NA 140 02/05/2022 0425   K 3.7 02/05/2022 0425   CL 105 02/05/2022 0425   CO2 25 02/05/2022 0425   GLUCOSE 75 02/05/2022 0425   BUN <5 (L) 02/05/2022 0425   CREATININE 0.44 02/05/2022 0425   CALCIUM 8.7 (L) 02/05/2022 0425   PROT 4.9 (L) 02/05/2022 0425   ALBUMIN 2.3 (L) 02/05/2022 0425   AST 19 02/05/2022 0425   ALT 10 02/05/2022 0425   ALKPHOS 77 02/05/2022 0425   BILITOT 0.4 02/05/2022 0425   GFRNONAA >60 02/05/2022 0425     Assessment/Plan:    41 y.o. G1P0 [redacted]w[redacted]d Pre-eclampsia w/o severe features - Procardia XL 30 mg PO daily - moderate range BP   CMP results Na 140 K 3.7 Will discuss lab results and assessment with Dr. Charlesetta Garibaldi to determine discharge and outpatient care.   Burman Foster, DNP, CNM 02/05/2022 8:34 AM

## 2022-02-05 NOTE — Discharge Instructions (Signed)
MS Opal Lang should be excused from work until she returns from maternity leave.

## 2022-02-06 NOTE — Discharge Summary (Signed)
  Obstetric Discharge Summary Reason for Admission:  preeclampsia without severe features Prenatal Procedures: NST, Preeclampsia, and ultrasound Intrapartum Procedures:  none Postpartum Procedures: none Complications-Operative and Postpartum: none    Hemoglobin  Date Value Ref Range Status  02/03/2022 10.3 (L) 12.0 - 15.0 g/dL Final   HCT  Date Value Ref Range Status  02/03/2022 30.8 (L) 36.0 - 46.0 % Final    Hospital Course:  Hospital Course: Admitted in for eval and treatment of highblood pressure and preeclampsia. . neg GBS.. .  She received adequate benefit from po pain medications. Sent home on procardia and wil have twice weekly visits.  PIH precautions given  Discharge Diagnoses: Preelampsia  Discharge Information: Date: 02/06/2022 Activity:  modified bed rest Diet: routine Medications:  Allergies as of 02/05/2022   No Known Allergies      Medication List     TAKE these medications    acetaminophen 325 MG tablet Commonly known as: TYLENOL Take 2 tablets (650 mg total) by mouth every 4 (four) hours as needed (for pain scale < 4  OR  temperature  >/=  100.5 F).   aspirin EC 81 MG tablet Take 81 mg by mouth daily. Swallow whole.   cholecalciferol 25 MCG (1000 UNIT) tablet Commonly known as: VITAMIN D3 Take 3,000 Units by mouth daily.   citalopram 20 MG tablet Commonly known as: CELEXA Take 20 mg by mouth daily.   doxylamine (Sleep) 25 MG tablet Commonly known as: UNISOM Take 1 tablet (25 mg total) by mouth at bedtime.   Doxylamine-Pyridoxine 10-10 MG Tbec Take by mouth.   ferrous sulfate 324 MG Tbec Take 324 mg by mouth.   multivitamin-prenatal 27-0.8 MG Tabs tablet Take 1 tablet by mouth daily at 12 noon.   NIFEdipine 30 MG 24 hr tablet Commonly known as: ADALAT CC Take 1 tablet (30 mg total) by mouth daily.       Condition: stable Instructions: refer to practice specific booklet Discharge to: home  [facility]. Schedule an appointment as soon as possible for a visit.   Specialty: Obstetrics and Gynecology Contact information: [address] [place] [address] [phone]                Newborn Data: Live born This patient has no babies on file.; APGAR , ; weight  ;  Home with  mom stillpregnant .  Benett Swoyer A Amran Malter 02/06/2022, 10:42 PM

## 2022-03-08 ENCOUNTER — Other Ambulatory Visit: Payer: Self-pay | Admitting: Obstetrics and Gynecology

## 2022-03-11 ENCOUNTER — Other Ambulatory Visit: Payer: Self-pay | Admitting: Obstetrics and Gynecology

## 2022-03-11 ENCOUNTER — Encounter (HOSPITAL_COMMUNITY): Payer: Self-pay | Admitting: *Deleted

## 2022-03-11 ENCOUNTER — Telehealth (HOSPITAL_COMMUNITY): Payer: Self-pay | Admitting: *Deleted

## 2022-03-19 ENCOUNTER — Inpatient Hospital Stay (HOSPITAL_COMMUNITY): Payer: BC Managed Care – PPO

## 2022-03-19 DIAGNOSIS — O99019 Anemia complicating pregnancy, unspecified trimester: Secondary | ICD-10-CM | POA: Diagnosis present

## 2022-03-19 DIAGNOSIS — O09513 Supervision of elderly primigravida, third trimester: Secondary | ICD-10-CM | POA: Diagnosis present

## 2022-03-19 NOTE — H&P (Cosign Needed)
OB ADMISSION/ HISTORY & PHYSICAL:  Admission Date: No admission date for patient encounter.  Admit Diagnosis: Pre-eclampsia without severe features  Sabrina Lindsey is a 41 y.o. female G1P0 [redacted]w[redacted]d presenting for IOL for pre-eclampsia without severe features. Endorses active FM, denies LOF and vaginal bleeding. Low-lying placenta @ 20 weeks, resolved by 24+6 wks.   History of current pregnancy: G1P0   Patient entered care with [facility] at 9+5 wks.   EDC 04/07/2022 by LMP and congruent w/ 10 wk U/S.   Anatomy scan:  24+6 wks, complete w/ posterior placenta.   Antenatal testing: for AMA started at 34 weeks Last evaluation: 36+5  wks vertex/ posterior placenta/ AFI 18/ EFW 6+3 (38%)  Significant prenatal events:  Patient Active Problem List   Diagnosis Date Noted   AMA (advanced maternal age) primigravida 16+, third trimester 03/19/2022   Anemia of pregnancy 03/19/2022   Pre-eclampsia without severe features 02/04/2022   Hypokalemia 01/31/2022    Prenatal Labs: ABO, Rh: --/--/O POS (10/31 3818) Antibody: NEG (10/31 0620) Rubella:   immune RPR:   NR HBsAg:   NR HIV:   NR GTT: normal 1 hr GBS: NEGATIVE/-- (11/01 2203)  GC/CHL: neg/neg Genetics: low-risk female, Horizon neg Vaccines: Tdap: declined Influenza: 03/02/22   OB History  Gravida Para Term Preterm AB Living  1            SAB IAB Ectopic Multiple Live Births               # Outcome Date GA Lbr Len/2nd Weight Sex Delivery Anes PTL Lv  1 Current             Medical / Surgical History: Past medical history:  Past Medical History:  Diagnosis Date   Anemia    Anxiety     Past surgical history:  Past Surgical History:  Procedure Laterality Date   TONSILLECTOMY     Family History:  Family History  Problem Relation Age of Onset   Hypertension Mother    Healthy Father    Hypertension Brother    Bell's palsy Brother     Social History:  reports that she has quit smoking. Her smoking use included cigarettes. She has  quit using smokeless tobacco. She reports that she does not currently use alcohol. She reports that she does not use drugs.  Allergies: Patient has no known allergies.   Current Medications at time of admission:  Prior to Admission medications   Medication Sig Start Date End Date Taking? Authorizing Provider  acetaminophen (TYLENOL) 325 MG tablet Take 2 tablets (650 mg total) by mouth every 4 (four) hours as needed (for pain scale < 4  OR  temperature  >/=  100.5 F). 02/05/22   Jaymes Graff, MD  aspirin EC 81 MG tablet Take 81 mg by mouth daily. Swallow whole.    [provider]  cholecalciferol (VITAMIN D3) 25 MCG (1000 UNIT) tablet Take 3,000 Units by mouth daily.    [provider]  citalopram (CELEXA) 20 MG tablet Take 20 mg by mouth daily.    [provider]  doxylamine, Sleep, (UNISOM) 25 MG tablet Take 1 tablet (25 mg total) by mouth at bedtime. 02/05/22   Jaymes Graff, MD  Doxylamine-Pyridoxine 10-10 MG TBEC Take by mouth.    [provider]  ferrous sulfate 324 MG TBEC Take 324 mg by mouth.    [provider]  NIFEdipine (ADALAT CC) 30 MG 24 hr tablet Take 1 tablet (30 mg total) by  mouth daily. 02/06/22   Jaymes Graff, MD  Prenatal Vit-Fe Fumarate-FA (MULTIVITAMIN-PRENATAL) 27-0.8 MG TABS tablet Take 1 tablet by mouth daily at 12 noon.    [provider]    Review of Systems: Constitutional: Negative   HENT: Negative   Eyes: Negative   Respiratory: Negative   Cardiovascular: Negative   Gastrointestinal: Negative  Genitourinary: Negative   Musculoskeletal: Negative   Skin: Negative   Neurological: Negative   Endo/Heme/Allergies: Negative   Psychiatric/Behavioral: Negative    Physical Exam: AAO x3, no signs of distress Cardiovascular: RRR Respiratory: Unlabored GU/GI: Abdomen gravid,   Prenatal Transfer Tool  Maternal Diabetes: No Genetic Screening: Normal Maternal Ultrasounds/Referrals: Normal Fetal  Ultrasounds or other Referrals:  None Maternal Substance Abuse:  No Significant Maternal Medications:  None Significant Maternal Lab Results: Group B Strep negative Number of Prenatal Visits:greater than 3 verified prenatal visits Other Comments:  None    Assessment: 41 y.o. G1P0 [redacted]w[redacted]d Pre-eclampsia without severe features  Induction of labor GBS negative Pain management plan: per pt's request   Plan:  Admit to L&D Routine admission orders Epidural PRN  Dr Su Hilt notified of admission and plan of care  Roma Schanz DNP, CNM 03/19/2022 6:41 AM

## 2022-03-20 ENCOUNTER — Encounter (HOSPITAL_COMMUNITY): Payer: Self-pay | Admitting: Obstetrics and Gynecology

## 2022-03-20 ENCOUNTER — Other Ambulatory Visit: Payer: Self-pay

## 2022-03-20 ENCOUNTER — Inpatient Hospital Stay (HOSPITAL_COMMUNITY)
Admission: AD | Admit: 2022-03-20 | Discharge: 2022-03-25 | DRG: 788 | Disposition: A | Discharge status: Home / Self Care | Payer: BC Managed Care – PPO | Attending: Obstetrics and Gynecology | Admitting: Obstetrics and Gynecology

## 2022-03-20 DIAGNOSIS — O1403 Mild to moderate pre-eclampsia, third trimester: Secondary | ICD-10-CM | POA: Diagnosis present

## 2022-03-20 DIAGNOSIS — Z87891 Personal history of nicotine dependence: Secondary | ICD-10-CM

## 2022-03-20 DIAGNOSIS — Z3A37 37 weeks gestation of pregnancy: Secondary | ICD-10-CM

## 2022-03-20 DIAGNOSIS — O1414 Severe pre-eclampsia complicating childbirth: Secondary | ICD-10-CM | POA: Diagnosis present

## 2022-03-20 DIAGNOSIS — O1404 Mild to moderate pre-eclampsia, complicating childbirth: Secondary | ICD-10-CM | POA: Diagnosis present

## 2022-03-20 DIAGNOSIS — O99019 Anemia complicating pregnancy, unspecified trimester: Secondary | ICD-10-CM | POA: Diagnosis present

## 2022-03-20 DIAGNOSIS — O9902 Anemia complicating childbirth: Secondary | ICD-10-CM | POA: Diagnosis present

## 2022-03-20 DIAGNOSIS — O09513 Supervision of elderly primigravida, third trimester: Secondary | ICD-10-CM | POA: Diagnosis present

## 2022-03-20 DIAGNOSIS — Z3490 Encounter for supervision of normal pregnancy, unspecified, unspecified trimester: Principal | ICD-10-CM | POA: Diagnosis present

## 2022-03-20 LAB — CBC
HCT: 36.9 % (ref 36.0–46.0)
Hemoglobin: 12.6 g/dL (ref 12.0–15.0)
MCH: 29.8 pg (ref 26.0–34.0)
MCHC: 34.1 g/dL (ref 30.0–36.0)
MCV: 87.2 fL (ref 80.0–100.0)
Platelets: 240 10*3/uL (ref 150–400)
RBC: 4.23 MIL/uL (ref 3.87–5.11)
RDW: 14.1 % (ref 11.5–15.5)
WBC: 8.1 10*3/uL (ref 4.0–10.5)
nRBC: 0 % (ref 0.0–0.2)

## 2022-03-20 LAB — COMPREHENSIVE METABOLIC PANEL
ALT: 14 U/L (ref 0–44)
AST: 30 U/L (ref 15–41)
Albumin: 2.7 g/dL — ABNORMAL LOW (ref 3.5–5.0)
Alkaline Phosphatase: 166 U/L — ABNORMAL HIGH (ref 38–126)
Anion gap: 9 (ref 5–15)
BUN: 7 mg/dL (ref 6–20)
CO2: 25 mmol/L (ref 22–32)
Calcium: 9.6 mg/dL (ref 8.9–10.3)
Chloride: 102 mmol/L (ref 98–111)
Creatinine, Ser: 0.56 mg/dL (ref 0.44–1.00)
GFR, Estimated: >60 mL/min (ref >60)
Glucose, Bld: 95 mg/dL (ref 70–99)
Potassium: 3.2 mmol/L — ABNORMAL LOW (ref 3.5–5.1)
Sodium: 136 mmol/L (ref 135–145)
Total Bilirubin: 0.3 mg/dL (ref 0.3–1.2)
Total Protein: 6.1 g/dL — ABNORMAL LOW (ref 6.5–8.1)

## 2022-03-20 LAB — URIC ACID: Uric Acid, Serum: 4.1 mg/dL (ref 2.5–7.1)

## 2022-03-20 LAB — GROUP B STREP BY PCR: Group B strep by PCR: NEGATIVE

## 2022-03-20 LAB — PROTEIN / CREATININE RATIO, URINE
Creatinine, Urine: 74 mg/dL
Protein Creatinine Ratio: 2.28 mg/mg{Cre} — ABNORMAL HIGH (ref 0.00–0.15)
Total Protein, Urine: 169 mg/dL

## 2022-03-20 LAB — TYPE AND SCREEN
ABO/RH(D): O POS
Antibody Screen: NEGATIVE

## 2022-03-20 LAB — RPR: RPR Ser Ql: NONREACTIVE

## 2022-03-20 MED ORDER — ONDANSETRON HCL 4 MG/2ML IJ SOLN
4.0000 mg | Freq: Four times a day (QID) | INTRAMUSCULAR | Status: DC | PRN
  Administered 2022-03-20 – 2022-03-21 (×2): 4 mg via INTRAVENOUS
  Filled 2022-03-20 (×2): qty 2

## 2022-03-20 MED ORDER — LABETALOL HCL 5 MG/ML IV SOLN
20.0000 mg | INTRAVENOUS | Status: DC | PRN
  Administered 2022-03-20 – 2022-03-21 (×3): 20 mg via INTRAVENOUS
  Filled 2022-03-20 (×3): qty 4

## 2022-03-20 MED ORDER — OXYTOCIN-SODIUM CHLORIDE 30-0.9 UT/500ML-% IV SOLN: 2.5000 [IU]/h | INTRAVENOUS | Status: DC

## 2022-03-20 MED ORDER — ACETAMINOPHEN 325 MG PO TABS
650.0000 mg | ORAL_TABLET | ORAL | Status: DC | PRN
  Administered 2022-03-20: 650 mg via ORAL
  Filled 2022-03-20: qty 2

## 2022-03-20 MED ORDER — LABETALOL HCL 200 MG PO TABS
300.0000 mg | ORAL_TABLET | Freq: Once | ORAL | Status: AC
  Administered 2022-03-20: 300 mg via ORAL
  Filled 2022-03-20: qty 1

## 2022-03-20 MED ORDER — FENTANYL CITRATE (PF) 100 MCG/2ML IJ SOLN: 50.0000 ug | INTRAMUSCULAR | Status: DC | PRN

## 2022-03-20 MED ORDER — LACTATED RINGERS IV SOLN: INTRAVENOUS | Status: DC

## 2022-03-20 MED ORDER — MAGNESIUM SULFATE BOLUS VIA INFUSION
4.0000 g | Freq: Once | INTRAVENOUS | Status: AC
  Administered 2022-03-20: 4 g via INTRAVENOUS
  Filled 2022-03-20: qty 1000

## 2022-03-20 MED ORDER — HYDRALAZINE HCL 20 MG/ML IJ SOLN
10.0000 mg | INTRAMUSCULAR | Status: DC | PRN
  Administered 2022-03-20: 10 mg via INTRAVENOUS
  Filled 2022-03-20: qty 1

## 2022-03-20 MED ORDER — SODIUM CHLORIDE 0.9 % IV SOLN
25.0000 mg | Freq: Four times a day (QID) | INTRAVENOUS | Status: DC | PRN
  Administered 2022-03-20: 25 mg via INTRAVENOUS
  Filled 2022-03-20 (×3): qty 1

## 2022-03-20 MED ORDER — SOD CITRATE-CITRIC ACID 500-334 MG/5ML PO SOLN
30.0000 mL | ORAL | Status: DC | PRN
  Administered 2022-03-20 – 2022-03-22 (×2): 30 mL via ORAL
  Filled 2022-03-20 (×2): qty 30

## 2022-03-20 MED ORDER — OXYCODONE-ACETAMINOPHEN 5-325 MG PO TABS: 2.0000 | ORAL_TABLET | ORAL | Status: DC | PRN

## 2022-03-20 MED ORDER — LABETALOL HCL 5 MG/ML IV SOLN
40.0000 mg | INTRAVENOUS | Status: DC | PRN
  Administered 2022-03-20 – 2022-03-21 (×2): 40 mg via INTRAVENOUS
  Filled 2022-03-20 (×2): qty 8

## 2022-03-20 MED ORDER — MISOPROSTOL 25 MCG QUARTER TABLET
25.0000 ug | ORAL_TABLET | ORAL | Status: AC
  Administered 2022-03-20 (×2): 25 ug via VAGINAL
  Filled 2022-03-20 (×2): qty 1

## 2022-03-20 MED ORDER — MAGNESIUM SULFATE 40 GM/1000ML IV SOLN
1.5000 g/h | INTRAVENOUS | Status: DC
  Filled 2022-03-20 (×3): qty 1000

## 2022-03-20 MED ORDER — LACTATED RINGERS IV SOLN: 500.0000 mL | INTRAVENOUS | Status: DC | PRN

## 2022-03-20 MED ORDER — OXYCODONE-ACETAMINOPHEN 5-325 MG PO TABS: 1.0000 | ORAL_TABLET | ORAL | Status: DC | PRN

## 2022-03-20 MED ORDER — SCOPOLAMINE 1 MG/3DAYS TD PT72
1.0000 | MEDICATED_PATCH | TRANSDERMAL | Status: DC
  Administered 2022-03-20: 1.5 mg via TRANSDERMAL
  Filled 2022-03-20: qty 1

## 2022-03-20 MED ORDER — LIDOCAINE HCL (PF) 1 % IJ SOLN: 30.0000 mL | INTRAMUSCULAR | Status: DC | PRN

## 2022-03-20 MED ORDER — OXYTOCIN BOLUS FROM INFUSION: 333.0000 mL | Freq: Once | INTRAVENOUS | Status: DC

## 2022-03-20 MED ORDER — ACETAMINOPHEN 10 MG/ML IV SOLN
1000.0000 mg | Freq: Four times a day (QID) | INTRAVENOUS | Status: AC
  Administered 2022-03-20 – 2022-03-21 (×2): 1000 mg via INTRAVENOUS
  Filled 2022-03-20 (×2): qty 100

## 2022-03-20 MED ORDER — NIFEDIPINE ER OSMOTIC RELEASE 30 MG PO TB24
30.0000 mg | ORAL_TABLET | Freq: Once | ORAL | Status: AC
  Administered 2022-03-20: 30 mg via ORAL
  Filled 2022-03-20: qty 1

## 2022-03-20 MED ORDER — TERBUTALINE SULFATE 1 MG/ML IJ SOLN: 0.2500 mg | Freq: Once | INTRAMUSCULAR | Status: DC | PRN

## 2022-03-20 MED ORDER — LABETALOL HCL 5 MG/ML IV SOLN
80.0000 mg | INTRAVENOUS | Status: DC | PRN
  Administered 2022-03-20: 80 mg via INTRAVENOUS
  Filled 2022-03-20: qty 16

## 2022-03-20 NOTE — Progress Notes (Signed)
Tracing reviewed by me remotely cat 1

## 2022-03-20 NOTE — Progress Notes (Signed)
Pt started on Mg for development of HA.  She is also having some nausea unrelieved with zofran so scopolamine patch ordered and phenergan prn.  Labs reviewed and LFTs wnl. PCR 2.28. 150/102 Tracing reviewed remotely by me baseline 125, moderate variability, accels present and no decels. Ctxs q80min s/p cytotec x 1 @ 1304  Will re-eval ctxs within next hour and spoke with Dahlia Client, RN to recheck pt and will decide at that time about 2nd dose of cytotec vs pitocin vs expectant mgmt. Pain medicine upon request Strict Is/Os

## 2022-03-20 NOTE — Progress Notes (Signed)
Vaginal cytotec #2 placed by RN @ 2100 Tracing reviewed remotely by me cat 1 BP 132/79

## 2022-03-21 ENCOUNTER — Encounter (HOSPITAL_COMMUNITY): Payer: Self-pay | Admitting: Obstetrics and Gynecology

## 2022-03-21 LAB — COMPREHENSIVE METABOLIC PANEL
ALT: 13 U/L (ref 0–44)
ALT: 13 U/L (ref 0–44)
AST: 28 U/L (ref 15–41)
AST: 31 U/L (ref 15–41)
Albumin: 2.5 g/dL — ABNORMAL LOW (ref 3.5–5.0)
Albumin: 2.5 g/dL — ABNORMAL LOW (ref 3.5–5.0)
Alkaline Phosphatase: 154 U/L — ABNORMAL HIGH (ref 38–126)
Alkaline Phosphatase: 168 U/L — ABNORMAL HIGH (ref 38–126)
Anion gap: 11 (ref 5–15)
Anion gap: 13 (ref 5–15)
BUN: 7 mg/dL (ref 6–20)
BUN: 9 mg/dL (ref 6–20)
CO2: 22 mmol/L (ref 22–32)
CO2: 24 mmol/L (ref 22–32)
Calcium: 7.3 mg/dL — ABNORMAL LOW (ref 8.9–10.3)
Calcium: 7.9 mg/dL — ABNORMAL LOW (ref 8.9–10.3)
Chloride: 100 mmol/L (ref 98–111)
Chloride: 100 mmol/L (ref 98–111)
Creatinine, Ser: 0.63 mg/dL (ref 0.44–1.00)
Creatinine, Ser: 0.71 mg/dL (ref 0.44–1.00)
GFR, Estimated: >60 mL/min (ref >60)
GFR, Estimated: >60 mL/min (ref >60)
Glucose, Bld: 108 mg/dL — ABNORMAL HIGH (ref 70–99)
Glucose, Bld: 112 mg/dL — ABNORMAL HIGH (ref 70–99)
Potassium: 3.2 mmol/L — ABNORMAL LOW (ref 3.5–5.1)
Potassium: 3.2 mmol/L — ABNORMAL LOW (ref 3.5–5.1)
Sodium: 135 mmol/L (ref 135–145)
Sodium: 135 mmol/L (ref 135–145)
Total Bilirubin: 0.3 mg/dL (ref 0.3–1.2)
Total Bilirubin: 0.5 mg/dL (ref 0.3–1.2)
Total Protein: 5.5 g/dL — ABNORMAL LOW (ref 6.5–8.1)
Total Protein: 5.6 g/dL — ABNORMAL LOW (ref 6.5–8.1)

## 2022-03-21 LAB — CBC
HCT: 34.7 % — ABNORMAL LOW (ref 36.0–46.0)
HCT: 35.9 % — ABNORMAL LOW (ref 36.0–46.0)
Hemoglobin: 12.1 g/dL (ref 12.0–15.0)
Hemoglobin: 12.8 g/dL (ref 12.0–15.0)
MCH: 30 pg (ref 26.0–34.0)
MCH: 30.7 pg (ref 26.0–34.0)
MCHC: 34.9 g/dL (ref 30.0–36.0)
MCHC: 35.7 g/dL (ref 30.0–36.0)
MCV: 86.1 fL (ref 80.0–100.0)
MCV: 86.1 fL (ref 80.0–100.0)
Platelets: 245 10*3/uL (ref 150–400)
Platelets: 250 10*3/uL (ref 150–400)
RBC: 4.03 MIL/uL (ref 3.87–5.11)
RBC: 4.17 MIL/uL (ref 3.87–5.11)
RDW: 14.4 % (ref 11.5–15.5)
RDW: 14.5 % (ref 11.5–15.5)
WBC: 13.9 10*3/uL — ABNORMAL HIGH (ref 4.0–10.5)
WBC: 9.6 10*3/uL (ref 4.0–10.5)
nRBC: 0 % (ref 0.0–0.2)
nRBC: 0 % (ref 0.0–0.2)

## 2022-03-21 LAB — MAGNESIUM
Magnesium: 5.3 mg/dL — ABNORMAL HIGH (ref 1.7–2.4)
Magnesium: 6.1 mg/dL (ref 1.7–2.4)

## 2022-03-21 MED ORDER — ZOLPIDEM TARTRATE 5 MG PO TABS: 10.0000 mg | ORAL_TABLET | Freq: Every evening | ORAL | Status: DC | PRN

## 2022-03-21 MED ORDER — MISOPROSTOL 25 MCG QUARTER TABLET
25.0000 ug | ORAL_TABLET | Freq: Once | ORAL | Status: AC
  Administered 2022-03-21: 25 ug via BUCCAL
  Filled 2022-03-21: qty 1

## 2022-03-21 MED ORDER — DIPHENHYDRAMINE HCL 50 MG/ML IJ SOLN: 12.5000 mg | INTRAMUSCULAR | Status: DC | PRN

## 2022-03-21 MED ORDER — EPHEDRINE 5 MG/ML INJ: 10.0000 mg | INTRAVENOUS | Status: DC | PRN

## 2022-03-21 MED ORDER — LACTATED RINGERS IV SOLN: 500.0000 mL | Freq: Once | INTRAVENOUS | Status: DC

## 2022-03-21 MED ORDER — TERBUTALINE SULFATE 1 MG/ML IJ SOLN: 0.2500 mg | Freq: Once | INTRAMUSCULAR | Status: DC | PRN

## 2022-03-21 MED ORDER — ZOLPIDEM TARTRATE 5 MG PO TABS
5.0000 mg | ORAL_TABLET | Freq: Every evening | ORAL | Status: DC | PRN
  Administered 2022-03-21: 5 mg via ORAL
  Filled 2022-03-21: qty 1

## 2022-03-21 MED ORDER — OXYTOCIN-SODIUM CHLORIDE 30-0.9 UT/500ML-% IV SOLN
1.0000 m[IU]/min | INTRAVENOUS | Status: DC
  Administered 2022-03-21: 1 m[IU]/min via INTRAVENOUS
  Filled 2022-03-21: qty 500

## 2022-03-21 MED ORDER — MISOPROSTOL 25 MCG QUARTER TABLET
25.0000 ug | ORAL_TABLET | Freq: Once | ORAL | Status: AC
  Administered 2022-03-21: 25 ug via VAGINAL
  Filled 2022-03-21: qty 1

## 2022-03-21 MED ORDER — FENTANYL-BUPIVACAINE-NACL 0.5-0.125-0.9 MG/250ML-% EP SOLN: 12.0000 mL/h | EPIDURAL | Status: DC | PRN

## 2022-03-21 MED ORDER — MISOPROSTOL 50MCG HALF TABLET
50.0000 ug | ORAL_TABLET | Freq: Once | ORAL | Status: AC
  Administered 2022-03-21: 50 ug via BUCCAL
  Filled 2022-03-21: qty 1

## 2022-03-21 MED ORDER — PHENYLEPHRINE 80 MCG/ML (10ML) SYRINGE FOR IV PUSH (FOR BLOOD PRESSURE SUPPORT): 80.0000 ug | PREFILLED_SYRINGE | INTRAVENOUS | Status: DC | PRN

## 2022-03-21 MED ORDER — CITALOPRAM HYDROBROMIDE 20 MG PO TABS
20.0000 mg | ORAL_TABLET | Freq: Every day | ORAL | Status: DC
  Administered 2022-03-21: 20 mg via ORAL
  Filled 2022-03-21 (×2): qty 1

## 2022-03-21 NOTE — Final Progress Note (Signed)
Pt is comfortable and desires cesarean section.  She was told the risks are but limited to bleeding, infection, hemorrhage, reoperation, and abnormal placentation in a subsequent pregnancy.  She ate a [employer] sandwhich so we will proceed at 6 am BP (!) 165/102 (BP Location: Right Arm)   Pulse 81   Temp 98.3 F (36.8 C)   Resp 20   Ht 5\' 2"  (1.575 m)   Wt 80.3 kg   SpO2 95%   BMI 32.39 kg/m

## 2022-03-21 NOTE — Anesthesia Preprocedure Evaluation (Signed)
Anesthesia Evaluation  Patient identified by MRN, date of birth, ID band Patient awake    Reviewed: Allergy & Precautions, NPO status , Patient's Chart, lab work & pertinent test results  History of Anesthesia Complications Negative for: history of anesthetic complications  Airway Mallampati: II  TM Distance: >3 FB Neck ROM: Full    Dental no notable dental hx.    Pulmonary neg pulmonary ROS, former smoker   Pulmonary exam normal        Cardiovascular Normal cardiovascular exam     Neuro/Psych   Anxiety     negative neurological ROS     GI/Hepatic negative GI ROS, Neg liver ROS,,,  Endo/Other  negative endocrine ROS    Renal/GU negative Renal ROS  negative genitourinary   Musculoskeletal negative musculoskeletal ROS (+)    Abdominal   Peds  Hematology negative hematology ROS (+)   Anesthesia Other Findings Day of surgery medications reviewed with patient.  Reproductive/Obstetrics (+) Pregnancy (preE on Mg)                              Anesthesia Physical Anesthesia Plan  ASA: 3  Anesthesia Plan: Spinal   Post-op Pain Management:    Induction:   PONV Risk Score and Plan: 4 or greater and Treatment may vary due to age or medical condition, Ondansetron and Dexamethasone  Airway Management Planned: Natural Airway  Additional Equipment:   Intra-op Plan:   Post-operative Plan:   Informed Consent: I have reviewed the patients History and Physical, chart, labs and discussed the procedure including the risks, benefits and alternatives for the proposed anesthesia with the patient or authorized representative who has indicated his/her understanding and acceptance.       Plan Discussed with: CRNA  Anesthesia Plan Comments:          Anesthesia Quick Evaluation

## 2022-03-21 NOTE — Hospital Course (Signed)
Pt with a dull headache. No RUQ pain, epigastric pain . BP (!) 132/91   Pulse 86   Temp 97.7 F (36.5 C) (Oral)   Resp 16   Ht 5\' 2"  (1.575 m)   Wt 80.3 kg   BMI 32.39 kg/m  140 decrease LTV Toco q2-5 min Ft /50/-3 Recent Results (from the past 2160 hour(s))  Protein / creatinine ratio, urine     Status: Abnormal   Collection Time: 01/31/22  3:24 PM  Result Value Ref Range   Creatinine, Urine 29 mg/dL   Total Protein, Urine 7 mg/dL    Comment: NO NORMAL RANGE ESTABLISHED FOR THIS TEST   Protein Creatinine Ratio 0.24 (H) 0.00 - 0.15 mg/mg[Cre]    Comment: Performed at [facility], [address] [place], [place]  CBC     Status: Abnormal   Collection Time: 01/31/22  3:25 PM  Result Value Ref Range   WBC 8.5 4.0 - 10.5 K/uL   RBC 3.46 (L) 3.87 - 5.11 MIL/uL   Hemoglobin 10.5 (L) 12.0 - 15.0 g/dL   HCT 02/02/22 (L) 62.1 - 30.8 %   MCV 84.4 80.0 - 100.0 fL   MCH 30.3 26.0 - 34.0 pg   MCHC 36.0 30.0 - 36.0 g/dL   RDW 65.7 84.6 - 96.2 %   Platelets 249 150 - 400 K/uL   nRBC 0.0 0.0 - 0.2 %    Comment: Performed at [facility], [address] [place], [place]  Comprehensive metabolic panel     Status: Abnormal   Collection Time: 01/31/22  3:25 PM  Result Value Ref Range   Sodium 139 135 - 145 mmol/L   Potassium 2.2 (LL) 3.5 - 5.1 mmol/L    Comment: CRITICAL RESULT CALLED TO, READ BACK BY AND VERIFIED WITH P. MORRIS RN 01/31/22 @1603  BY J. WHITE   Chloride 99 98 - 111 mmol/L   CO2 26 22 - 32 mmol/L   Glucose, Bld 151 (H) 70 - 99 mg/dL    Comment: Glucose reference range applies only to samples taken after fasting for at least 8 hours.   BUN <5 (L) 6 - 20 mg/dL   Creatinine, Ser 02/02/22 0.44 - 1.00 mg/dL   Calcium 8.9 8.9 - mg/dL   Total Protein 5.5 (L) 6.5 - 8.1 g/dL   Albumin 2.5 (L) 3.5 - 5.0 g/dL   AST 29 15 - 41 U/L   ALT 15 0 - 44 U/L   Alkaline Phosphatase 76 38 - 126 U/L   Total Bilirubin 0.4 0.3 - 1.2 mg/dL   GFR, Estimated 4.40  10.2 mL/min    Comment: (NOTE) Calculated using the CKD-EPI Creatinine Equation (2021)    Anion gap 14 5 - 15    Comment: Performed at [facility], [address] [place], [address] [place]  Urinalysis, Routine w reflex microscopic Urine, Clean Catch     Status: Abnormal   Collection Time: 01/31/22  3:26 PM  Result Value Ref Range   Color, Urine YELLOW YELLOW   APPearance HAZY (A) CLEAR   Specific Gravity, Urine 1.004 (L) 1.005 - 1.030   pH 6.0 5.0 - 8.0   Glucose, UA NEGATIVE NEGATIVE mg/dL   Hgb urine dipstick NEGATIVE NEGATIVE   Bilirubin Urine NEGATIVE NEGATIVE   Ketones, ur 20 (A) NEGATIVE mg/dL   Protein, ur NEGATIVE NEGATIVE mg/dL   Nitrite NEGATIVE NEGATIVE   Leukocytes,Ua NEGATIVE NEGATIVE    Comment: Performed  at [facility], [address] [place], [place] [address]  Potassium     Status: Abnormal   Collection Time: 01/31/22  4:28 PM  Result Value Ref Range   Potassium 2.0 (LL) 3.5 - 5.1 mmol/L    Comment: CRITICAL RESULT CALLED TO, READ BACK BY AND VERIFIED WITH P. MORRIS RN 01/31/22 BY J. WHITE Performed at [facility], [address], [place], [place] [address]   Magnesium     Status: Abnormal   Collection Time: 01/31/22  4:28 PM  Result Value Ref Range   Magnesium 1.5 (L) 1.7 - 2.4 mg/dL    Comment: Performed at [facility], [address], [place], [place] [address]  Type and screen [facility]     Status: None   Collection Time: 02/01/22  6:20 AM  Result Value Ref Range   ABO/RH(D) O POS    Antibody Screen NEG    Sample Expiration      02/04/2022,2359 Performed at [facility], [address] [place], [place] [address]   Potassium     Status: Abnormal   Collection Time: 02/01/22  6:22 AM  Result Value Ref Range   Potassium 2.5 (LL) 3.5 - 5.1 mmol/L    Comment: CRITICAL RESULT CALLED TO, READ BACK BY AND VERIFIED WITH C.GALLOWAY,RN 0747 02/01/22 CLARK,S Performed at [facility], [address] [place], [place] [address]   Magnesium     Status: None   Collection Time: 02/01/22  6:22 AM  Result Value Ref Range   Magnesium 1.8 1.7 - 2.4 mg/dL    Comment: Performed at [facility], [address] [place], [place] [address]  Potassium     Status: Abnormal   Collection Time: 02/01/22  3:41 PM  Result Value Ref Range   Potassium 2.5 (LL) 3.5 - 5.1 mmol/L    Comment: CRITICAL VALUE NOTED. VALUE IS CONSISTENT WITH PREVIOUSLY REPORTED/CALLED VALUE Performed at [facility], [address], [place], [place] [address]   CBC     Status: Abnormal   Collection Time: 02/02/22 10:39 AM  Result Value Ref Range   WBC 10.0 4.0 - 10.5 K/uL   RBC 3.75 (L) 3.87 - 5.11 MIL/uL   Hemoglobin 11.4 (L) 12.0 - 15.0 g/dL   HCT 37.8 (L) 58.8 - 50.2 %   MCV 86.9 80.0 - 100.0 fL   MCH 30.4 26.0 - 34.0 pg   MCHC 35.0 30.0 - 36.0 g/dL   RDW 77.4 12.8 - 78.6 %   Platelets 263 150 - 400 K/uL   nRBC 0.0 0.0 - 0.2 %    Comment: Performed at [facility], [address], [place], [place] [address]  Comprehensive metabolic panel     Status: Abnormal   Collection Time: 02/02/22 10:39 AM  Result Value Ref Range   Sodium 140 135 - 145 mmol/L   Potassium 3.4 (L) 3.5 - 5.1 mmol/L    Comment: HEMOLYSIS AT THIS LEVEL MAY AFFECT RESULT   Chloride 102 98 - 111 mmol/L   CO2 24 22 - 32 mmol/L   Glucose, Bld 99 70 - 99 mg/dL    Comment: Glucose reference range applies only to samples taken after fasting for at least 8 hours.   BUN <5 (L) 6 - 20 mg/dL   Creatinine, Ser 9.47 0.44 - 1.00 mg/dL   Calcium 8.4 (L) 8.9 - 10.3 mg/dL   Total Protein 5.6 (  L) 6.5 - 8.1 g/dL   Albumin 2.6 (L) 3.5 - 5.0 g/dL   AST 35 15 - 41 U/L    Comment: HEMOLYSIS AT THIS LEVEL MAY AFFECT RESULT   ALT 13 0 - 44 U/L    Comment: HEMOLYSIS AT THIS LEVEL MAY AFFECT RESULT   Alkaline Phosphatase 82 38 - 126 U/L   Total Bilirubin 1.1 0.3 - 1.2 mg/dL    Comment: HEMOLYSIS AT THIS LEVEL MAY AFFECT RESULT   GFR, Estimated >60  >60 mL/min    Comment: (NOTE) Calculated using the CKD-EPI Creatinine Equation (2021)    Anion gap 14 5 - 15    Comment: Performed at [facility], [address] [place], [place] [address]  Protein / creatinine ratio, urine     Status: Abnormal   Collection Time: 02/02/22  5:21 PM  Result Value Ref Range   Creatinine, Urine 91 mg/dL   Total Protein, Urine 27 mg/dL    Comment: NO NORMAL RANGE ESTABLISHED FOR THIS TEST   Protein Creatinine Ratio 0.30 (H) 0.00 - 0.15 mg/mg[Cre]    Comment: Performed at [facility], [address], [place], [place] [address]  Protein, urine, 24 hour     Status: Abnormal   Collection Time: 02/02/22  8:20 PM  Result Value Ref Range   Urine Total Volume-UPROT 3,500 mL   Collection Interval-UPROT 24 hours   Protein, Urine 7 mg/dL   Protein, 09W Urine 119 (H) 50 - 100 mg/day    Comment: Performed at [facility], [address], [place], [place] [address] CORRECTED ON 11/02 AT 2213: PREVIOUSLY REPORTED AS NOT CALCULATED   Group B strep by PCR     Status: None   Collection Time: 02/02/22 10:03 PM   Specimen: Vaginal/Rectal; Genital  Result Value Ref Range   Group B strep by PCR NEGATIVE NEGATIVE    Comment: (NOTE) Intrapartum testing with Xpert GBS assay should be used as an adjunct to other methods available and not used to replace antepartum testing (at 35-[redacted] weeks gestation). Performed at [facility], [address], [place], [place] [address]   CBC     Status: Abnormal   Collection Time: 02/03/22  7:14 AM  Result Value Ref Range   WBC 8.1 4.0 - 10.5 K/uL   RBC 3.50 (L) 3.87 - 5.11 MIL/uL   Hemoglobin 10.3 (L) 12.0 - 15.0 g/dL   HCT 30.8 (L) 65.7 - 84.6 %   MCV 88.0 80.0 - 100.0 fL   MCH 29.4 26.0 - 34.0 pg   MCHC 33.4 30.0 - 36.0 g/dL   RDW 96.2 95.2 - 84.1 %   Platelets 251 150 - 400 K/uL   nRBC 0.0 0.0 - 0.2 %    Comment: Performed at [facility], [address] [place], [place] [address]   Comprehensive metabolic panel     Status: Abnormal   Collection Time: 02/03/22  7:14 AM  Result Value Ref Range   Sodium 140 135 - 145 mmol/L   Potassium 2.7 (LL) 3.5 - 5.1 mmol/L    Comment: CRITICAL RESULT CALLED TO, READ BACK BY AND VERIFIED WITH C.GALLOWAY,RN 0759 02/03/22 CLARK,S   Chloride 104 98 - 111 mmol/L   CO2 25 22 - 32 mmol/L   Glucose, Bld 97 70 - 99 mg/dL    Comment: Glucose reference range applies only to samples taken after fasting for at least 8 hours.   BUN <5 (L) 6 -  20 mg/dL   Creatinine, Ser 3.24 0.44 - 1.00 mg/dL   Calcium 8.6 (L) 8.9 - 10.3 mg/dL   Total Protein 5.1 (L) 6.5 - 8.1 g/dL   Albumin 2.3 (L) 3.5 - 5.0 g/dL   AST 20 15 - 41 U/L   ALT 12 0 - 44 U/L   Alkaline Phosphatase 71 38 - 126 U/L   Total Bilirubin 0.4 0.3 - 1.2 mg/dL   GFR, Estimated >40 >10 mL/min    Comment: (NOTE) Calculated using the CKD-EPI Creatinine Equation (2021)    Anion gap 11 5 - 15    Comment: Performed at [facility], [address], [place], [place] [address]  Magnesium     Status: Abnormal   Collection Time: 02/03/22  7:14 AM  Result Value Ref Range   Magnesium 1.5 (L) 1.7 - 2.4 mg/dL    Comment: Performed at [facility], [address], [place], [place] [address]  Amylase     Status: None   Collection Time: 02/03/22  7:14 AM  Result Value Ref Range   Amylase 37 28 - 100 U/L    Comment: Performed at [facility], [address] [place], [place] [address]  Lipase, blood     Status: None   Collection Time: 02/03/22  7:14 AM  Result Value Ref Range   Lipase 26 11 - 51 U/L    Comment: Performed at [facility], [address], [place], [place] [address]  Urinalysis, Routine w reflex microscopic Urine, Clean Catch     Status: Abnormal   Collection Time: 02/03/22  9:17 PM  Result Value Ref Range   Color, Urine YELLOW YELLOW   APPearance CLOUDY (A) CLEAR   Specific Gravity, Urine 1.011 1.005 - 1.030   pH 6.0 5.0 - 8.0   Glucose, UA 50 (A)  NEGATIVE mg/dL   Hgb urine dipstick NEGATIVE NEGATIVE   Bilirubin Urine NEGATIVE NEGATIVE   Ketones, ur 5 (A) NEGATIVE mg/dL   Protein, ur NEGATIVE NEGATIVE mg/dL   Nitrite NEGATIVE NEGATIVE   Leukocytes,Ua NEGATIVE NEGATIVE   RBC / HPF 0-5 0 - 5 RBC/hpf   WBC, UA 0-5 0 - 5 WBC/hpf   Bacteria, UA FEW (A) NONE SEEN   Squamous Epithelial / LPF >50 (H) 0 - 5   Mucus PRESENT    Non Squamous Epithelial 0-5 (A) NONE SEEN    Comment: Performed at [facility], [address], [place], [place] [address]  Potassium     Status: Abnormal   Collection Time: 02/04/22  4:15 AM  Result Value Ref Range   Potassium 3.3 (L) 3.5 - 5.1 mmol/L    Comment: Performed at [facility], [address] [place], [place] [address]  Magnesium     Status: Abnormal   Collection Time: 02/04/22  4:15 AM  Result Value Ref Range   Magnesium 1.3 (L) 1.7 - 2.4 mg/dL    Comment: Performed at [facility], [address] [place], [place] [address]  Comprehensive metabolic panel     Status: Abnormal   Collection Time: 02/05/22  4:25 AM  Result Value Ref Range   Sodium 140 135 - 145 mmol/L   Potassium 3.7 3.5 - 5.1 mmol/L   Chloride 105 98 - 111 mmol/L   CO2 25 22 - 32 mmol/L   Glucose, Bld 75 70 - 99 mg/dL    Comment: Glucose reference range applies only to samples taken  after fasting for at least 8 hours.   BUN <5 (L) 6 - 20 mg/dL   Creatinine, Ser 4.09 0.44 - 1.00 mg/dL   Calcium 8.7 (L) 8.9 - 10.3 mg/dL   Total Protein 4.9 (L) 6.5 - 8.1 g/dL   Albumin 2.3 (L) 3.5 - 5.0 g/dL   AST 19 15 - 41 U/L   ALT 10 0 - 44 U/L   Alkaline Phosphatase 77 38 - 126 U/L   Total Bilirubin 0.4 0.3 - 1.2 mg/dL   GFR, Estimated >81 >19 mL/min    Comment: (NOTE) Calculated using the CKD-EPI Creatinine Equation (2021)    Anion gap 10 5 - 15    Comment: Performed at [facility], [address] [place], [place] [address]  CBC     Status: None   Collection Time: 03/20/22 11:25 AM  Result Value Ref Range    WBC 8.1 4.0 - 10.5 K/uL   RBC 4.23 3.87 - 5.11 MIL/uL   Hemoglobin 12.6 12.0 - 15.0 g/dL   HCT 95.6 21.3 - 08.6 %   MCV 87.2 80.0 - 100.0 fL   MCH 29.8 26.0 - 34.0 pg   MCHC 34.1 30.0 - 36.0 g/dL   RDW 57.8 46.9 - 62.9 %   Platelets 240 150 - 400 K/uL   nRBC 0.0 0.0 - 0.2 %    Comment: Performed at [facility], [address], [place], [place] [address]  Type and screen [facility]     Status: None   Collection Time: 03/20/22 11:25 AM  Result Value Ref Range   ABO/RH(D) O POS    Antibody Screen NEG    Sample Expiration      03/23/2022,2359 Performed at [facility], [address] [place], [place] [address]   RPR     Status: None   Collection Time: 03/20/22 11:25 AM  Result Value Ref Range   RPR Ser Ql NON REACTIVE NON REACTIVE    Comment: Performed at [facility], [address] [place], [place] [address]  Comprehensive metabolic panel     Status: Abnormal   Collection Time: 03/20/22 11:25 AM  Result Value Ref Range   Sodium 136 135 - 145 mmol/L   Potassium 3.2 (L) 3.5 - 5.1 mmol/L   Chloride 102 98 - 111 mmol/L   CO2 25 22 - 32 mmol/L   Glucose, Bld 95 70 - 99 mg/dL    Comment: Glucose reference range applies only to samples taken after fasting for at least 8 hours.   BUN 7 6 - 20 mg/dL   Creatinine, Ser 5.36 0.44 - 1.00 mg/dL   Calcium 9.6 8.9 - 64.4 mg/dL   Total Protein 6.1 (L) 6.5 - 8.1 g/dL   Albumin 2.7 (L) 3.5 - 5.0 g/dL   AST 30 15 - 41 U/L   ALT 14 0 - 44 U/L   Alkaline Phosphatase 166 (H) 38 - 126 U/L   Total Bilirubin 0.3 0.3 - 1.2 mg/dL   GFR, Estimated >03 >47 mL/min    Comment: (NOTE) Calculated using the CKD-EPI Creatinine Equation (2021)    Anion gap 9 5 - 15    Comment: Performed at [facility], [address], [place], [place] [address]  Uric acid     Status: None   Collection Time: 03/20/22 11:25 AM  Result Value Ref Range   Uric Acid, Serum 4.1 2.5 - 7.1 mg/dL    Comment:  HEMOLYSIS AT THIS  LEVEL MAY AFFECT RESULT Performed at [facility], [address] [place], [place] [address]   Protein / creatinine ratio, urine     Status: Abnormal   Collection Time: 03/20/22 11:25 AM  Result Value Ref Range   Creatinine, Urine 74 mg/dL   Total Protein, Urine 169 mg/dL    Comment: RESULT CONFIRMED BY MANUAL DILUTION NO NORMAL RANGE ESTABLISHED FOR THIS TEST    Protein Creatinine Ratio 2.28 (H) 0.00 - 0.15 mg/mgVa [facility][Cre]    Comment: Performed at [facility], [address] [place], [place] [address]  Group B strep by PCR     Status: None   Collection Time: 03/20/22  6:19 PM   Specimen: Vaginal/Rectal; Genital  Result Value Ref Range   Group B strep by PCR NEGATIVE NEGATIVE    Comment: (NOTE) Intrapartum testing with Xpert GBS assay should be used as an adjunct to other methods available and not used to replace antepartum testing (at 35-[redacted] weeks gestation). Performed at [facility], [address] [place], [place] [address]   Magnesium     Status: Abnormal   Collection Time: 03/20/22 11:52 PM  Result Value Ref Range   Magnesium 5.3 (H) 1.7 - 2.4 mg/dL    Comment: Performed at [facility], [address] [place], [place] [address]  Comprehensive metabolic panel     Status: Abnormal   Collection Time: 03/20/22 11:52 PM  Result Value Ref Range   Sodium 135 135 - 145 mmol/L   Potassium 3.2 (L) 3.5 - 5.1 mmol/L   Chloride 100 98 - 111 mmol/L   CO2 22 22 - 32 mmol/L   Glucose, Bld 108 (H) 70 - 99 mg/dL    Comment: Glucose reference range applies only to samples taken after fasting for at least 8 hours.   BUN 9 6 - 20 mg/dL   Creatinine, Ser 2.950.63 0.44 - 1.00 mg/dL   Calcium 7.9 (L) 8.9 - 10.3 mg/dL   Total Protein 5.5 (L) 6.5 - 8.1 g/dL   Albumin 2.5 (L) 3.5 - 5.0 g/dL   AST 31 15 - 41 U/L   ALT 13 0 - 44 U/L   Alkaline Phosphatase 154 (H) 38 - 126 U/L   Total Bilirubin 0.5 0.3 - 1.2 mg/dL   GFR, Estimated >62>60 >13>60 mL/min    Comment:  (NOTE) Calculated using the CKD-EPI Creatinine Equation (2021)    Anion gap 13 5 - 15    Comment: Performed at [facility], [address] [place], [place] [address]  CBC     Status: Abnormal   Collection Time: 03/20/22 11:52 PM  Result Value Ref Range   WBC 13.9 (H) 4.0 - 10.5 K/uL   RBC 4.03 3.87 - 5.11 MIL/uL   Hemoglobin 12.1 12.0 - 15.0 g/dL   HCT 84.634.7 (L) 96.236.0 - 95.246.0 %   MCV 86.1 80.0 - 100.0 fL   MCH 30.0 26.0 - 34.0 pg   MCHC 34.9 30.0 - 36.0 g/dL   RDW 84.114.4 32.411.5 - 40.115.5 %   Platelets 245 150 - 400 K/uL   nRBC 0.0 0.0 - 0.2 %    Comment: Performed at [facility], [address] [place], [place] [address]  Magnesium     Status: Abnormal   Collection Time: 03/21/22  7:30 AM  Result Value Ref Range   Magnesium 6.1 (HH) 1.7 - 2.4 mg/dL    Comment: CRITICAL RESULT  CALLED TO, READ BACK BY AND VERIFIED WITH L.EDMUNDS,RN 0828 03/21/22 CLARK,S Performed at [facility], [address], [place], [place] [address]   Comprehensive metabolic panel     Status: Abnormal   Collection Time: 03/21/22  7:30 AM  Result Value Ref Range   Sodium 135 135 - 145 mmol/L   Potassium 3.2 (L) 3.5 - 5.1 mmol/L   Chloride 100 98 - 111 mmol/L   CO2 24 22 - 32 mmol/L   Glucose, Bld 112 (H) 70 - 99 mg/dL    Comment: Glucose reference range applies only to samples taken after fasting for at least 8 hours.   BUN 7 6 - 20 mg/dL   Creatinine, Ser 0.27 0.44 - 1.00 mg/dL   Calcium 7.3 (L) 8.9 - 10.3 mg/dL   Total Protein 5.6 (L) 6.5 - 8.1 g/dL   Albumin 2.5 (L) 3.5 - 5.0 g/dL   AST 28 15 - 41 U/L   ALT 13 0 - 44 U/L   Alkaline Phosphatase 168 (H) 38 - 126 U/L   Total Bilirubin 0.3 0.3 - 1.2 mg/dL   GFR, Estimated >25 >36 mL/min    Comment: (NOTE) Calculated using the CKD-EPI Creatinine Equation (2021)    Anion gap 11 5 - 15    Comment: Performed at [facility] Lab, [address] [place], [place] [address]  CBC     Status: Abnormal   Collection Time: 03/21/22  7:30 AM   Result Value Ref Range   WBC 9.6 4.0 - 10.5 K/uL   RBC 4.17 3.87 - 5.11 MIL/uL   Hemoglobin 12.8 12.0 - 15.0 g/dL   HCT 47.4 (L) 25.9 - 56.3 %   MCV 86.1 80.0 - 100.0 fL   MCH 30.7 26.0 - 34.0 pg   MCHC 35.7 30.0 - 36.0 g/dL   RDW 87.5 64.3 - 32.9 %   Platelets 250 150 - 400 K/uL   nRBC 0.0 0.0 - 0.2 %    Comment: Performed at [facility], [address], [place], [place] [address]    Will give buccal cytotec Stop pitocin Decrease mag to 1.5 grams per hour.  Urine output adequate Will attempt to place a foley for mechanical dilation once she has more dilation Pt stable Anticipate SVD

## 2022-03-21 NOTE — Progress Notes (Signed)
Patient ID: Sabrina Lindsey, female   DOB: October 31, 1980, 41 y.o.   MRN: [id number] BPs better s/p labetalol 300mg , currently in mild range Will recheck labs this morning S/p 3rd dose of vaginal cytotec, will start pitocin  VE per RN 1/50/-3 Ctxs q2-8 (pt still not feeling them) Tracing reviewed remotely by me cat 1

## 2022-03-22 ENCOUNTER — Encounter (HOSPITAL_COMMUNITY)
Admission: AD | Disposition: A | Discharge status: Home / Self Care | Payer: Self-pay | Source: Home / Self Care | Attending: Obstetrics and Gynecology

## 2022-03-22 ENCOUNTER — Encounter (HOSPITAL_COMMUNITY): Payer: Self-pay | Admitting: Obstetrics and Gynecology

## 2022-03-22 ENCOUNTER — Inpatient Hospital Stay (HOSPITAL_COMMUNITY): Payer: BC Managed Care – PPO | Admitting: Anesthesiology

## 2022-03-22 ENCOUNTER — Other Ambulatory Visit: Payer: Self-pay

## 2022-03-22 LAB — COMPREHENSIVE METABOLIC PANEL
ALT: 12 U/L (ref 0–44)
AST: 24 U/L (ref 15–41)
Albumin: 2.2 g/dL — ABNORMAL LOW (ref 3.5–5.0)
Alkaline Phosphatase: 162 U/L — ABNORMAL HIGH (ref 38–126)
Anion gap: 11 (ref 5–15)
BUN: 7 mg/dL (ref 6–20)
CO2: 24 mmol/L (ref 22–32)
Calcium: 6.8 mg/dL — ABNORMAL LOW (ref 8.9–10.3)
Chloride: 100 mmol/L (ref 98–111)
Creatinine, Ser: 0.61 mg/dL (ref 0.44–1.00)
GFR, Estimated: >60 mL/min (ref >60)
Glucose, Bld: 90 mg/dL (ref 70–99)
Potassium: 2.9 mmol/L — ABNORMAL LOW (ref 3.5–5.1)
Sodium: 135 mmol/L (ref 135–145)
Total Bilirubin: 0.4 mg/dL (ref 0.3–1.2)
Total Protein: 5.1 g/dL — ABNORMAL LOW (ref 6.5–8.1)

## 2022-03-22 LAB — CBC WITH DIFFERENTIAL/PLATELET
Abs Immature Granulocytes: 0.07 10*3/uL (ref 0.00–0.07)
Basophils Absolute: 0 10*3/uL (ref 0.0–0.1)
Basophils Relative: 0 %
Eosinophils Absolute: 0 10*3/uL (ref 0.0–0.5)
Eosinophils Relative: 1 %
HCT: 33.4 % — ABNORMAL LOW (ref 36.0–46.0)
Hemoglobin: 11.8 g/dL — ABNORMAL LOW (ref 12.0–15.0)
Immature Granulocytes: 1 %
Lymphocytes Relative: 20 %
Lymphs Abs: 1.6 10*3/uL (ref 0.7–4.0)
MCH: 30.8 pg (ref 26.0–34.0)
MCHC: 35.3 g/dL (ref 30.0–36.0)
MCV: 87.2 fL (ref 80.0–100.0)
Monocytes Absolute: 0.6 10*3/uL (ref 0.1–1.0)
Monocytes Relative: 7 %
Neutro Abs: 5.7 10*3/uL (ref 1.7–7.7)
Neutrophils Relative %: 71 %
Platelets: 220 10*3/uL (ref 150–400)
RBC: 3.83 MIL/uL — ABNORMAL LOW (ref 3.87–5.11)
RDW: 14.9 % (ref 11.5–15.5)
WBC: 8 10*3/uL (ref 4.0–10.5)
nRBC: 0 % (ref 0.0–0.2)

## 2022-03-22 LAB — MAGNESIUM: Magnesium: 5.3 mg/dL — ABNORMAL HIGH (ref 1.7–2.4)

## 2022-03-22 SURGERY — Surgical Case
Anesthesia: Spinal

## 2022-03-22 MED ORDER — LABETALOL HCL 5 MG/ML IV SOLN: 80.0000 mg | INTRAVENOUS | Status: DC | PRN

## 2022-03-22 MED ORDER — MORPHINE SULFATE (PF) 0.5 MG/ML IJ SOLN
INTRAMUSCULAR | Status: AC
  Filled 2022-03-22: qty 10

## 2022-03-22 MED ORDER — ONDANSETRON HCL 4 MG/2ML IJ SOLN
INTRAMUSCULAR | Status: AC
  Filled 2022-03-22: qty 2

## 2022-03-22 MED ORDER — FENTANYL CITRATE (PF) 100 MCG/2ML IJ SOLN
INTRAMUSCULAR | Status: AC
  Filled 2022-03-22: qty 2

## 2022-03-22 MED ORDER — OXYTOCIN-SODIUM CHLORIDE 30-0.9 UT/500ML-% IV SOLN: 2.5000 [IU]/h | INTRAVENOUS | Status: AC

## 2022-03-22 MED ORDER — NALOXONE HCL 4 MG/10ML IJ SOLN: 1.0000 ug/kg/h | INTRAVENOUS | Status: DC | PRN

## 2022-03-22 MED ORDER — KETOROLAC TROMETHAMINE 30 MG/ML IJ SOLN: 30.0000 mg | Freq: Four times a day (QID) | INTRAMUSCULAR | Status: AC | PRN

## 2022-03-22 MED ORDER — ACETAMINOPHEN 10 MG/ML IV SOLN
INTRAVENOUS | Status: AC
  Filled 2022-03-22: qty 100

## 2022-03-22 MED ORDER — COCONUT OIL OIL: 1.0000 | TOPICAL_OIL | Status: DC | PRN

## 2022-03-22 MED ORDER — ACETAMINOPHEN 160 MG/5ML PO SOLN: 1000.0000 mg | Freq: Once | ORAL | Status: DC

## 2022-03-22 MED ORDER — LACTATED RINGERS IV SOLN: INTRAVENOUS | Status: DC

## 2022-03-22 MED ORDER — DIPHENHYDRAMINE HCL 50 MG/ML IJ SOLN: 12.5000 mg | INTRAMUSCULAR | Status: DC | PRN

## 2022-03-22 MED ORDER — SIMETHICONE 80 MG PO CHEW
80.0000 mg | CHEWABLE_TABLET | Freq: Three times a day (TID) | ORAL | Status: DC
  Administered 2022-03-22 – 2022-03-25 (×8): 80 mg via ORAL
  Filled 2022-03-22 (×8): qty 1

## 2022-03-22 MED ORDER — SIMETHICONE 80 MG PO CHEW: 80.0000 mg | CHEWABLE_TABLET | ORAL | Status: DC | PRN

## 2022-03-22 MED ORDER — ACETAMINOPHEN 500 MG PO TABS: 1000.0000 mg | ORAL_TABLET | Freq: Once | ORAL | Status: DC

## 2022-03-22 MED ORDER — NIFEDIPINE ER OSMOTIC RELEASE 30 MG PO TB24
30.0000 mg | ORAL_TABLET | Freq: Every day | ORAL | Status: DC
  Administered 2022-03-22: 30 mg via ORAL
  Filled 2022-03-22: qty 1

## 2022-03-22 MED ORDER — MORPHINE SULFATE (PF) 0.5 MG/ML IJ SOLN
INTRAMUSCULAR | Status: DC | PRN
  Administered 2022-03-22: 150 ug via INTRATHECAL

## 2022-03-22 MED ORDER — PRENATAL MULTIVITAMIN CH
1.0000 | ORAL_TABLET | Freq: Every day | ORAL | Status: DC
  Administered 2022-03-23 – 2022-03-24 (×2): 1 via ORAL
  Filled 2022-03-22 (×2): qty 1

## 2022-03-22 MED ORDER — PHENYLEPHRINE HCL-NACL 20-0.9 MG/250ML-% IV SOLN
INTRAVENOUS | Status: DC | PRN
  Administered 2022-03-22: 60 ug/min via INTRAVENOUS

## 2022-03-22 MED ORDER — ZOLPIDEM TARTRATE 5 MG PO TABS: 5.0000 mg | ORAL_TABLET | Freq: Every evening | ORAL | Status: DC | PRN

## 2022-03-22 MED ORDER — HYDRALAZINE HCL 20 MG/ML IJ SOLN: 10.0000 mg | INTRAMUSCULAR | Status: DC | PRN

## 2022-03-22 MED ORDER — TRANEXAMIC ACID-NACL 1000-0.7 MG/100ML-% IV SOLN
INTRAVENOUS | Status: AC
  Filled 2022-03-22: qty 100

## 2022-03-22 MED ORDER — NIFEDIPINE ER OSMOTIC RELEASE 60 MG PO TB24
60.0000 mg | ORAL_TABLET | Freq: Every day | ORAL | Status: DC
  Administered 2022-03-23: 60 mg via ORAL
  Filled 2022-03-22: qty 1

## 2022-03-22 MED ORDER — WITCH HAZEL-GLYCERIN EX PADS: 1.0000 | MEDICATED_PAD | CUTANEOUS | Status: DC | PRN

## 2022-03-22 MED ORDER — FENTANYL CITRATE (PF) 100 MCG/2ML IJ SOLN: 25.0000 ug | INTRAMUSCULAR | Status: DC | PRN

## 2022-03-22 MED ORDER — ONDANSETRON HCL 4 MG/2ML IJ SOLN
4.0000 mg | Freq: Three times a day (TID) | INTRAMUSCULAR | Status: DC | PRN
  Filled 2022-03-22: qty 2

## 2022-03-22 MED ORDER — SODIUM CHLORIDE 0.9% FLUSH: 3.0000 mL | INTRAVENOUS | Status: DC | PRN

## 2022-03-22 MED ORDER — BUPIVACAINE IN DEXTROSE 0.75-8.25 % IT SOLN
INTRATHECAL | Status: DC | PRN
  Administered 2022-03-22: 1.6 mL via INTRATHECAL

## 2022-03-22 MED ORDER — TRANEXAMIC ACID-NACL 1000-0.7 MG/100ML-% IV SOLN
INTRAVENOUS | Status: DC | PRN
  Administered 2022-03-22: 1000 mg via INTRAVENOUS

## 2022-03-22 MED ORDER — LABETALOL HCL 5 MG/ML IV SOLN
40.0000 mg | INTRAVENOUS | Status: DC | PRN
  Administered 2022-03-22 – 2022-03-23 (×2): 40 mg via INTRAVENOUS
  Filled 2022-03-22 (×2): qty 8

## 2022-03-22 MED ORDER — ACETAMINOPHEN 500 MG PO TABS
1000.0000 mg | ORAL_TABLET | Freq: Four times a day (QID) | ORAL | Status: AC
  Administered 2022-03-22 – 2022-03-23 (×4): 1000 mg via ORAL
  Filled 2022-03-22 (×4): qty 2

## 2022-03-22 MED ORDER — MAGNESIUM SULFATE BOLUS VIA INFUSION: 2.0000 g | Freq: Once | INTRAVENOUS | Status: DC

## 2022-03-22 MED ORDER — NALOXONE HCL 0.4 MG/ML IJ SOLN: 0.4000 mg | INTRAMUSCULAR | Status: DC | PRN

## 2022-03-22 MED ORDER — NIFEDIPINE ER OSMOTIC RELEASE 30 MG PO TB24
30.0000 mg | ORAL_TABLET | Freq: Once | ORAL | Status: AC
  Administered 2022-03-22: 30 mg via ORAL
  Filled 2022-03-22: qty 1

## 2022-03-22 MED ORDER — MAGNESIUM SULFATE BOLUS VIA INFUSION
2.0000 g/h | Freq: Once | INTRAVENOUS | Status: DC
  Filled 2022-03-22: qty 1000

## 2022-03-22 MED ORDER — CITALOPRAM HYDROBROMIDE 20 MG PO TABS
20.0000 mg | ORAL_TABLET | Freq: Every day | ORAL | Status: DC
  Administered 2022-03-22 – 2022-03-25 (×4): 20 mg via ORAL
  Filled 2022-03-22 (×5): qty 1

## 2022-03-22 MED ORDER — KETOROLAC TROMETHAMINE 30 MG/ML IJ SOLN: 30.0000 mg | Freq: Once | INTRAMUSCULAR | Status: DC

## 2022-03-22 MED ORDER — MAGNESIUM SULFATE 40 GM/1000ML IV SOLN
2.0000 g/h | INTRAVENOUS | Status: AC
  Administered 2022-03-22 – 2022-03-23 (×3): 2 g/h via INTRAVENOUS
  Filled 2022-03-22: qty 1000

## 2022-03-22 MED ORDER — DIBUCAINE (PERIANAL) 1 % EX OINT: 1.0000 | TOPICAL_OINTMENT | CUTANEOUS | Status: DC | PRN

## 2022-03-22 MED ORDER — ONDANSETRON HCL 4 MG/2ML IJ SOLN
4.0000 mg | Freq: Four times a day (QID) | INTRAMUSCULAR | Status: DC | PRN
  Administered 2022-03-22 (×2): 4 mg via INTRAVENOUS
  Filled 2022-03-22: qty 2

## 2022-03-22 MED ORDER — ACETAMINOPHEN 325 MG PO TABS
650.0000 mg | ORAL_TABLET | ORAL | Status: DC | PRN
  Filled 2022-03-22: qty 2

## 2022-03-22 MED ORDER — LABETALOL HCL 5 MG/ML IV SOLN
20.0000 mg | INTRAVENOUS | Status: DC | PRN
  Administered 2022-03-22 – 2022-03-24 (×4): 20 mg via INTRAVENOUS
  Filled 2022-03-22 (×4): qty 4

## 2022-03-22 MED ORDER — MENTHOL 3 MG MT LOZG: 1.0000 | LOZENGE | OROMUCOSAL | Status: DC | PRN

## 2022-03-22 MED ORDER — DROPERIDOL 2.5 MG/ML IJ SOLN: 0.6250 mg | Freq: Once | INTRAMUSCULAR | Status: DC | PRN

## 2022-03-22 MED ORDER — OXYTOCIN-SODIUM CHLORIDE 30-0.9 UT/500ML-% IV SOLN
INTRAVENOUS | Status: DC | PRN
  Administered 2022-03-22: 200 [IU] via INTRAVENOUS
  Administered 2022-03-22: 300 [IU] via INTRAVENOUS

## 2022-03-22 MED ORDER — DIPHENHYDRAMINE HCL 25 MG PO CAPS: 25.0000 mg | ORAL_CAPSULE | Freq: Four times a day (QID) | ORAL | Status: DC | PRN

## 2022-03-22 MED ORDER — CEFAZOLIN SODIUM-DEXTROSE 1-4 GM/50ML-% IV SOLN
INTRAVENOUS | Status: DC | PRN
  Administered 2022-03-22: 2 g via INTRAVENOUS

## 2022-03-22 MED ORDER — DEXAMETHASONE SODIUM PHOSPHATE 10 MG/ML IJ SOLN
INTRAMUSCULAR | Status: AC
  Filled 2022-03-22: qty 1

## 2022-03-22 MED ORDER — DEXAMETHASONE SODIUM PHOSPHATE 4 MG/ML IJ SOLN
INTRAMUSCULAR | Status: DC | PRN
  Administered 2022-03-22: 10 mg via INTRAVENOUS

## 2022-03-22 MED ORDER — FENTANYL CITRATE (PF) 100 MCG/2ML IJ SOLN
INTRAMUSCULAR | Status: DC | PRN
  Administered 2022-03-22: 15 ug via INTRATHECAL

## 2022-03-22 MED ORDER — SENNOSIDES-DOCUSATE SODIUM 8.6-50 MG PO TABS
2.0000 | ORAL_TABLET | ORAL | Status: DC
  Administered 2022-03-23 – 2022-03-24 (×2): 2 via ORAL
  Filled 2022-03-22 (×2): qty 2

## 2022-03-22 MED ORDER — OXYCODONE HCL 5 MG PO TABS: 5.0000 mg | ORAL_TABLET | ORAL | Status: DC | PRN

## 2022-03-22 MED ORDER — DIPHENHYDRAMINE HCL 25 MG PO CAPS: 25.0000 mg | ORAL_CAPSULE | ORAL | Status: DC | PRN

## 2022-03-22 SURGICAL SUPPLY — 34 items
BENZOIN TINCTURE PRP APPL 2/3 (GAUZE/BANDAGES/DRESSINGS) IMPLANT
CHLORAPREP W/TINT 26 (MISCELLANEOUS) ×2 IMPLANT
CLAMP UMBILICAL CORD (MISCELLANEOUS) ×1 IMPLANT
CLOTH BEACON ORANGE TIMEOUT ST (SAFETY) ×1 IMPLANT
DRSG OPSITE POSTOP 4X10 (GAUZE/BANDAGES/DRESSINGS) ×1 IMPLANT
ELECT REM PT RETURN 9FT ADLT (ELECTROSURGICAL) ×1
ELECTRODE REM PT RTRN 9FT ADLT (ELECTROSURGICAL) ×1 IMPLANT
EXTRACTOR VACUUM M CUP 4 TUBE (SUCTIONS) IMPLANT
GAUZE SPONGE 4X4 12PLY STRL LF (GAUZE/BANDAGES/DRESSINGS) IMPLANT
GLOVE BIOGEL PI IND STRL 7.0 (GLOVE) ×3 IMPLANT
GLOVE ECLIPSE 7.0 STRL STRAW (GLOVE) ×1 IMPLANT
GOWN STRL REUS W/TWL LRG LVL3 (GOWN DISPOSABLE) ×2 IMPLANT
HEMOSTAT SURGICEL 2X3 (HEMOSTASIS) IMPLANT
KIT ABG SYR 3ML LUER SLIP (SYRINGE) IMPLANT
NDL HYPO 25X5/8 SAFETYGLIDE (NEEDLE) ×1 IMPLANT
NEEDLE HYPO 22GX1.5 SAFETY (NEEDLE) ×1 IMPLANT
NEEDLE HYPO 25X5/8 SAFETYGLIDE (NEEDLE) ×1 IMPLANT
NS IRRIG 1000ML POUR BTL (IV SOLUTION) ×1 IMPLANT
PACK C SECTION WH (CUSTOM PROCEDURE TRAY) ×1 IMPLANT
PAD ABD 7.5X8 STRL (GAUZE/BANDAGES/DRESSINGS) ×1 IMPLANT
PAD OB MATERNITY 4.3X12.25 (PERSONAL CARE ITEMS) ×1 IMPLANT
RETRACTOR WND ALEXIS 25 LRG (MISCELLANEOUS) IMPLANT
RTRCTR C-SECT PINK 25CM LRG (MISCELLANEOUS) IMPLANT
RTRCTR WOUND ALEXIS 25CM LRG (MISCELLANEOUS) ×1
STRIP CLOSURE SKIN 1/2X4 (GAUZE/BANDAGES/DRESSINGS) IMPLANT
SUT PDS AB 0 CTX 36 PDP370T (SUTURE) IMPLANT
SUT PLAIN 2 0 XLH (SUTURE) IMPLANT
SUT VIC AB 0 CTX 36 (SUTURE) ×2
SUT VIC AB 0 CTX36XBRD ANBCTRL (SUTURE) ×2 IMPLANT
SUT VIC AB 4-0 KS 27 (SUTURE) ×1 IMPLANT
SYR CONTROL 10ML LL (SYRINGE) ×1 IMPLANT
TOWEL OR 17X24 6PK STRL BLUE (TOWEL DISPOSABLE) ×1 IMPLANT
TRAY FOLEY W/BAG SLVR 14FR LF (SET/KITS/TRAYS/PACK) ×1 IMPLANT
WATER STERILE IRR 1000ML POUR (IV SOLUTION) ×1 IMPLANT

## 2022-03-22 NOTE — Anesthesia Postprocedure Evaluation (Signed)
Anesthesia Post Note  Patient: Jacquilyn Selner  Procedure(s) Performed: CESAREAN SECTION     Patient location during evaluation: PACU Anesthesia Type: Spinal Level of consciousness: awake and alert and oriented Pain management: pain level controlled Vital Signs Assessment: post-procedure vital signs reviewed and stable Respiratory status: spontaneous breathing, nonlabored ventilation and respiratory function stable Cardiovascular status: blood pressure returned to baseline and stable Postop Assessment: no headache, no backache, spinal receding and no apparent nausea or vomiting Anesthetic complications: no   No notable events documented.  Last Vitals:  Vitals:   03/22/22 0745 03/22/22 0800  BP: (!) 138/94 (!) 149/98  Pulse: 75 73  Resp: (!) 22 20  Temp: 36.5 C   SpO2: 96% 93%    Last Pain:  Vitals:   03/22/22 0745  TempSrc:   PainSc: 0-No pain   Pain Goal: Patients Stated Pain Goal: 5 (03/22/22 0745)  LLE Motor Response: No movement due to regional block (03/22/22 0800) LLE Sensation: Tingling (03/22/22 0800) RLE Motor Response: No movement due to regional block (03/22/22 0800) RLE Sensation: Tingling (03/22/22 0800) L Sensory Level: T10-Umbilical region (03/22/22 0800) R Sensory Level: T10-Umbilical region (03/22/22 0800) Epidural/Spinal Function Cutaneous sensation: Tingles (03/22/22 0745), Patient able to flex knees: No (03/22/22 0745), Patient able to lift hips off bed: No (03/22/22 0745), Back pain beyond tenderness at insertion site: No (03/22/22 0745), Progressively worsening motor and/or sensory loss: No (03/22/22 0745), Bowel and/or bladder incontinence post epidural: No (03/22/22 0745)  Lannie Fields

## 2022-03-22 NOTE — Transfer of Care (Signed)
Immediate Anesthesia Transfer of Care Note  Patient: Sabrina Lindsey  Procedure(s) Performed: CESAREAN SECTION  Patient Location: PACU  Anesthesia Type:Spinal  Level of Consciousness: awake  Airway & Oxygen Therapy: Patient Spontanous Breathing  Post-op Assessment: Report given to RN  Post vital signs: Reviewed and stable  Last Vitals:  Vitals Value Taken Time  BP    Temp    Pulse    Resp 17 03/22/22 0743  SpO2    Vitals shown include unvalidated device data.  Last Pain:  Vitals:   03/22/22 0500  TempSrc:   PainSc: Asleep         Complications: No notable events documented.

## 2022-03-22 NOTE — Anesthesia Procedure Notes (Signed)
Spinal  Patient location during procedure: OR Start time: 03/22/2022 6:22 AM End time: 03/22/2022 6:25 AM Reason for block: surgical anesthesia Staffing Performed: anesthesiologist  Anesthesiologist: Kaylyn Layer, MD Performed by: Kaylyn Layer, MD Authorized by: Kaylyn Layer, MD   Preanesthetic Checklist Completed: patient identified, IV checked, risks and benefits discussed, monitors and equipment checked, pre-op evaluation and timeout performed Spinal Block Patient position: sitting Prep: DuraPrep and site prepped and draped Patient monitoring: heart rate, continuous pulse ox and blood pressure Approach: midline Location: L3-4 Injection technique: single-shot Needle Needle type: Pencan  Needle gauge: 24 G Needle length: 10 cm Assessment Sensory level: T4 Events: CSF return Additional Notes Risks, benefits, and alternative discussed. Patient gave consent to procedure. Prepped and draped in sitting position. Clear CSF obtained after one needle pass. Positive terminal aspiration. No pain or paraesthesias with injection. Patient tolerated procedure well. Vital signs stable. Amalia Greenhouse, MD

## 2022-03-22 NOTE — H&P (Signed)
MOB was referred for history of anxiety. * Referral screened out by Clinical Social Worker because none of the following criteria appear to apply: ~ History of anxiety/depression during this pregnancy, or of post-partum depression following prior delivery. ~ Diagnosis of anxiety and/or depression within last 3 years OR * MOB's symptoms currently being treated with medication and/or therapy. Per chart review, MOB is prescribed/taking Citalopram.   Please contact the Clinical Social Worker if needs arise, by MOB request, or if MOB scores greater than 9/yes to question 10 on [place] Postpartum Depression Screen.  Wonda Goodgame, LCSW Clinical Social Worker [facility] Cell#: [phone] 

## 2022-03-22 NOTE — Op Note (Signed)
Cesarean Section Procedure Note   Sabrina Lindsey  03/22/2022  Indications:  preeclampsia with severe features.  Mother desired primary cesarean     Pre-operative Diagnosis: Cesarean Section-Elective. Preeclampsia with sevre features  Post-operative Diagnosis: Same  with meconium  Surgeon: Surgeon(s) and Role:    * Jaymes Graff, MD - Primary   Assistants: [facility] CNM   Anesthesia: spinal   Procedure Details:  The patient was seen in the Holding Room. The risks, benefits, complications, treatment options, and expected outcomes were discussed with the patient. The patient concurred with the proposed plan, giving informed consent. identified as Sabrina Lindsey and the procedure verified as C-Section Delivery. A Time Out was held and the above information confirmed.  After induction of anesthesia, the patient was draped and prepped in the usual sterile manner. A transverse incision was made and carried down through the subcutaneous tissue to the fascia. Fascial incision was made in the midline and extended transversely. The fascia was separated from the underlying rectus muscle superiorly and inferiorly. The peritoneum was identified and entered. Peritoneal incision was extended longitudinally with good visualization of bowel and bladder. The utero-vesical peritoneal reflection was incised transversely and the bladder flap was bluntly freed from the lower uterine segment.  An alexsis retractor was placed in the abdomen.   A low transverse uterine incision was made. Delivered from cephalic presentation was a  infant, with Apgar scores of 4 at one minute and 8 at five minutes. Cord ph was sent the umbilical cord was clamped and cut cord blood was obtained for evaluation. The placenta was removed Intact and appeared normal. The uterine outline, tubes and ovaries appeared normal}. The uterine incision was closed with running locked sutures of 0Vicryl. A second layer 0 vicrlyl was used to imbricate the  uterine incision    Hemostasis was observed. Lavage was carried out until clear. The alexsis was removed.  The peritoneum was closed with 0 chromic.  The muscles were examined and any bleeders were made hemostatic using bovie cautery device.   The fascia was then reapproximated with running sutures of 0 vicryl.  The subcutaneous tissue was reapproximated  With interrupted stitches using 2-0 plain gut. The subcuticular closure was performed using 3-69monocryl     Instrument, sponge, and needle counts were correct prior the abdominal closure and were correct at the conclusion of the case.    Findings: infant was delivered from vtx presentation. The fluid was meconium.  The uterus tubes and ovaries appeared normal.     Estimated Blood Loss: 217cc   Total IV Fluids:   Urine Output:  5950CC OF clear urine  Specimens: placenta sent to path  Complications: no complications  Disposition: PACU - hemodynamically stable.   Maternal Condition: stable   Baby condition / location:  Couplet care / Skin to Skin  Attending Attestation: I performed the procedure.an assistant was needed for the complexity of the case   Signed: Surgeon(s): Jaymes Graff, MD

## 2022-03-22 NOTE — Progress Notes (Signed)
Ofirmev 1000mg  was given at 0820 in PACU verbal order from MDA

## 2022-03-22 NOTE — Progress Notes (Signed)
Ready to proceed with CS BP (!) 144/92   Pulse 75   Temp 98.5 F (36.9 C) (Oral)   Resp 16   Ht 5\' 2"  (1.575 m)   Wt 80.3 kg   SpO2 95%   BMI 32.39 kg/m  Cat 1 CBC    Component Value Date/Time   WBC 8.0 03/22/2022 0546   RBC 3.83 (L) 03/22/2022 0546   HGB 11.8 (L) 03/22/2022 0546   HCT 33.4 (L) 03/22/2022 0546   PLT 220 03/22/2022 0546   MCV 87.2 03/22/2022 0546   MCH 30.8 03/22/2022 0546   MCHC 35.3 03/22/2022 0546   RDW 14.9 03/22/2022 0546   LYMPHSABS 1.6 03/22/2022 0546   MONOABS 0.6 03/22/2022 0546   EOSABS 0.0 03/22/2022 0546   BASOSABS 0.0 03/22/2022 0546

## 2022-03-23 LAB — COMPREHENSIVE METABOLIC PANEL
ALT: 12 U/L (ref 0–44)
AST: 24 U/L (ref 15–41)
Albumin: 2.1 g/dL — ABNORMAL LOW (ref 3.5–5.0)
Alkaline Phosphatase: 136 U/L — ABNORMAL HIGH (ref 38–126)
Anion gap: 9 (ref 5–15)
BUN: 8 mg/dL (ref 6–20)
CO2: 28 mmol/L (ref 22–32)
Calcium: 6.3 mg/dL — CL (ref 8.9–10.3)
Chloride: 99 mmol/L (ref 98–111)
Creatinine, Ser: 0.6 mg/dL (ref 0.44–1.00)
GFR, Estimated: >60 mL/min (ref >60)
Glucose, Bld: 89 mg/dL (ref 70–99)
Potassium: 3.7 mmol/L (ref 3.5–5.1)
Sodium: 136 mmol/L (ref 135–145)
Total Bilirubin: 0.4 mg/dL (ref 0.3–1.2)
Total Protein: 4.9 g/dL — ABNORMAL LOW (ref 6.5–8.1)

## 2022-03-23 LAB — CBC
HCT: 32.6 % — ABNORMAL LOW (ref 36.0–46.0)
Hemoglobin: 10.9 g/dL — ABNORMAL LOW (ref 12.0–15.0)
MCH: 29.9 pg (ref 26.0–34.0)
MCHC: 33.4 g/dL (ref 30.0–36.0)
MCV: 89.6 fL (ref 80.0–100.0)
Platelets: 219 10*3/uL (ref 150–400)
RBC: 3.64 MIL/uL — ABNORMAL LOW (ref 3.87–5.11)
RDW: 14.9 % (ref 11.5–15.5)
WBC: 12.8 10*3/uL — ABNORMAL HIGH (ref 4.0–10.5)
nRBC: 0 % (ref 0.0–0.2)

## 2022-03-23 LAB — MAGNESIUM: Magnesium: 6.3 mg/dL (ref 1.7–2.4)

## 2022-03-23 MED ORDER — CALCIUM CARBONATE ANTACID 500 MG PO CHEW
2.0000 | CHEWABLE_TABLET | Freq: Three times a day (TID) | ORAL | Status: DC
  Administered 2022-03-23 – 2022-03-25 (×7): 400 mg via ORAL
  Filled 2022-03-23 (×8): qty 2

## 2022-03-23 MED ORDER — NIFEDIPINE ER OSMOTIC RELEASE 60 MG PO TB24
90.0000 mg | ORAL_TABLET | Freq: Every day | ORAL | Status: DC
  Administered 2022-03-24: 90 mg via ORAL
  Filled 2022-03-23: qty 1

## 2022-03-23 MED ORDER — NIFEDIPINE ER OSMOTIC RELEASE 30 MG PO TB24
30.0000 mg | ORAL_TABLET | Freq: Once | ORAL | Status: AC
  Administered 2022-03-23: 30 mg via ORAL
  Filled 2022-03-23: qty 1

## 2022-03-23 NOTE — Progress Notes (Signed)
CSW received consult due to score 10 on Edinburgh Depression Screen. CSW met with MOB at bedside to complete assessment. MOB was accompanied by her mother. CSW introduced self. MOB granted CSW verbal permission to speak in front of her mother about anything. CSW explained reason for consult. MOB was welcoming, pleasant, open, and remained engaged during assessment. CSW and MOB discussed MOB's mental health history. MOB shared that she went to her doctor to get medication for anxiety last year. MOB reported that the medication is helpful but feels it could be more helpful. MOB shared that due to pregnancy her medication options are limited. MOB verbalized a plan to speak with her provider now that she has delivered about her medication options. CSW positively affirmed MOB's plan to follow up with her provider about medication options as she feels the medication could be more effective. MOB denied any current symptoms of anxiety with the exception of normal anxiety about being a first time mom. CSW and MOB discussed elevated edinburgh score 10. MOB shared that she had some worries about the induction and the labor/delivery process. MOB denied any additional stressors. MOB denied any additional mental health history. CSW asked if MOB was interested in therapy resources, MOB was open to having resources if needed. CSW provided MOB with local mental health resource. CSW inquired about how MOB was feeling emotionally since giving birth, MOB reported that she was feeling relieved and overall good. MOB presented calm and did not demonstrate any acute mental health signs/symptoms. CSW assessed for safety, MOB denied SI, HI, and domestic violence. CSW inquired about MOB's support system, MOB reported that her mother husband are supports.   CSW provided education regarding Baby Blues vs PMADs and provided MOB with resources for mental health follow up.  CSW encouraged MOB to evaluate her mental health throughout the  postpartum period with the use of the New Mom Checklist developed by [employer] as well as the [place] Postnatal Depression Scale and notify a medical professional if symptoms arise.     CSW provided review of Sudden Infant Death Syndrome (SIDS) precautions. MOB verbalized understanding and reported having all items needed to care for infant including a car seat, basinet, and crib. MOB thanked CSW for visit.   CSW identifies no further need for intervention and no barriers to discharge at this time.  Abundio Miu, [place] Worker [facility] Cell#: [phone]

## 2022-03-23 NOTE — Progress Notes (Signed)
Subjective: Postpartum Day 1: Cesarean Delivery Patient reports tolerating PO and no problems voiding.  Denies HA, visual changes or abdominal pain.  Objective: Vital signs in last 24 hours: Temp:  [97.6 F (36.4 C)-98.2 F (36.8 C)] 97.6 F (36.4 C) (12/20 0751) Pulse Rate:  [69-81] 73 (12/20 0751) Resp:  [15-19] 17 (12/20 0751) BP: (115-180)/(75-104) 148/89 (12/20 0751) SpO2:  [90 %-99 %] 97 % (12/20 0751)  Physical Exam:  General: alert and no distress Lochia: appropriate Uterine Fundus: Firm, NT Incision: dressing intact and no staining DVT Evaluation: No evidence of DVT seen on physical exam.  Recent Labs    03/22/22 0546 03/23/22 0515  HGB 11.8* 10.9*  HCT 33.4* 32.6*    Assessment/Plan: Status post Cesarean section. Postoperative course complicated by elevated BPs    Continue current care. Procardia increased to 60mg  daily last night.  BPs still elevated.  Will give another dose of procardia xl 30mg  and increase daily procardial xl to 90mg  daily. SCDs for DVT prophylaxis Will recheck labs in am  , MD 03/23/2022, 9:56 AM

## 2022-03-24 LAB — COMPREHENSIVE METABOLIC PANEL
ALT: 14 U/L (ref 0–44)
AST: 27 U/L (ref 15–41)
Albumin: 2.2 g/dL — ABNORMAL LOW (ref 3.5–5.0)
Alkaline Phosphatase: 126 U/L (ref 38–126)
Anion gap: 10 (ref 5–15)
BUN: 7 mg/dL (ref 6–20)
CO2: 29 mmol/L (ref 22–32)
Calcium: 8.5 mg/dL — ABNORMAL LOW (ref 8.9–10.3)
Chloride: 100 mmol/L (ref 98–111)
Creatinine, Ser: 0.58 mg/dL (ref 0.44–1.00)
GFR, Estimated: >60 mL/min (ref >60)
Glucose, Bld: 85 mg/dL (ref 70–99)
Potassium: 2.9 mmol/L — ABNORMAL LOW (ref 3.5–5.1)
Sodium: 139 mmol/L (ref 135–145)
Total Bilirubin: 0.3 mg/dL (ref 0.3–1.2)
Total Protein: 5.1 g/dL — ABNORMAL LOW (ref 6.5–8.1)

## 2022-03-24 LAB — SURGICAL PATHOLOGY

## 2022-03-24 LAB — CBC
HCT: 31.9 % — ABNORMAL LOW (ref 36.0–46.0)
Hemoglobin: 11.1 g/dL — ABNORMAL LOW (ref 12.0–15.0)
MCH: 30.4 pg (ref 26.0–34.0)
MCHC: 34.8 g/dL (ref 30.0–36.0)
MCV: 87.4 fL (ref 80.0–100.0)
Platelets: 221 10*3/uL (ref 150–400)
RBC: 3.65 MIL/uL — ABNORMAL LOW (ref 3.87–5.11)
RDW: 14.6 % (ref 11.5–15.5)
WBC: 11.2 10*3/uL — ABNORMAL HIGH (ref 4.0–10.5)
nRBC: 0 % (ref 0.0–0.2)

## 2022-03-24 MED ORDER — LABETALOL HCL 100 MG PO TABS
100.0000 mg | ORAL_TABLET | Freq: Two times a day (BID) | ORAL | Status: DC
  Administered 2022-03-24: 100 mg via ORAL
  Filled 2022-03-24: qty 1

## 2022-03-24 MED ORDER — NIFEDIPINE ER OSMOTIC RELEASE 30 MG PO TB24
30.0000 mg | ORAL_TABLET | Freq: Once | ORAL | Status: AC
  Administered 2022-03-24: 30 mg via ORAL
  Filled 2022-03-24: qty 1

## 2022-03-24 MED ORDER — NIFEDIPINE ER OSMOTIC RELEASE 60 MG PO TB24: 120.0000 mg | ORAL_TABLET | Freq: Every day | ORAL | Status: DC

## 2022-03-24 MED ORDER — LABETALOL HCL 200 MG PO TABS
200.0000 mg | ORAL_TABLET | Freq: Three times a day (TID) | ORAL | Status: DC
  Administered 2022-03-24 – 2022-03-25 (×2): 200 mg via ORAL
  Filled 2022-03-24 (×2): qty 1

## 2022-03-24 MED ORDER — NIFEDIPINE ER OSMOTIC RELEASE 60 MG PO TB24
60.0000 mg | ORAL_TABLET | Freq: Two times a day (BID) | ORAL | Status: DC
  Administered 2022-03-25: 60 mg via ORAL
  Filled 2022-03-24: qty 1

## 2022-03-24 MED ORDER — POTASSIUM CHLORIDE CRYS ER 20 MEQ PO TBCR
20.0000 meq | EXTENDED_RELEASE_TABLET | Freq: Two times a day (BID) | ORAL | Status: DC
  Administered 2022-03-24 – 2022-03-25 (×2): 20 meq via ORAL
  Filled 2022-03-24 (×2): qty 1

## 2022-03-24 NOTE — Progress Notes (Signed)
Subjective: Postpartum Day 2: Cesarean Delivery Patient reports tolerating PO, + flatus, + BM and no problems voiding.    Objective: Vital signs in last 24 hours: Temp:  [97.8 F (36.6 C)-98.9 F (37.2 C)] 98.1 F (36.7 C) (12/21 1436) Pulse Rate:  [69-89] 81 (12/21 1514) Resp:  [18-21] 21 (12/21 1436) BP: (143-186)/(83-105) 149/87 (12/21 1514) SpO2:  [93 %-100 %] 97 % (12/21 1436)  Physical Exam:  General: alert Lochia: appropriate Uterine Fundus: firm Incision: healing well, no significant drainage DVT Evaluation: No evidence of DVT seen on physical exam.  Recent Labs    03/23/22 0515 03/24/22 0501  HGB 10.9* 11.1*  HCT 32.6* 31.9*    Assessment/Plan: Status post Cesarean section. Pt with flatus Bp still elevated and occasionally in severe range .  Continue procardia and labetalol added TID Continue current care. Pt would like to be discharged tomorrow if possible  Eugune Sine A Demetrus Pavao 03/24/2022, 5:48 PM

## 2022-03-25 LAB — COMPREHENSIVE METABOLIC PANEL
ALT: 19 U/L (ref 0–44)
AST: 32 U/L (ref 15–41)
Albumin: 2.3 g/dL — ABNORMAL LOW (ref 3.5–5.0)
Alkaline Phosphatase: 127 U/L — ABNORMAL HIGH (ref 38–126)
Anion gap: 13 (ref 5–15)
BUN: 7 mg/dL (ref 6–20)
CO2: 28 mmol/L (ref 22–32)
Calcium: 9.2 mg/dL (ref 8.9–10.3)
Chloride: 99 mmol/L (ref 98–111)
Creatinine, Ser: 0.6 mg/dL (ref 0.44–1.00)
GFR, Estimated: >60 mL/min (ref >60)
Glucose, Bld: 94 mg/dL (ref 70–99)
Potassium: 2.8 mmol/L — ABNORMAL LOW (ref 3.5–5.1)
Sodium: 140 mmol/L (ref 135–145)
Total Bilirubin: 0.3 mg/dL (ref 0.3–1.2)
Total Protein: 5.4 g/dL — ABNORMAL LOW (ref 6.5–8.1)

## 2022-03-25 MED ORDER — NIFEDIPINE ER 60 MG PO TB24: 60.0000 mg | ORAL_TABLET | Freq: Two times a day (BID) | ORAL | 2 refills | Status: AC

## 2022-03-25 MED ORDER — OXYCODONE HCL 5 MG PO TABS: 5.0000 mg | ORAL_TABLET | ORAL | 0 refills | Status: AC | PRN

## 2022-03-25 MED ORDER — LABETALOL HCL 200 MG PO TABS: 200.0000 mg | ORAL_TABLET | Freq: Three times a day (TID) | ORAL | 2 refills | Status: AC

## 2022-03-25 MED ORDER — IBUPROFEN 600 MG PO TABS: 600.0000 mg | ORAL_TABLET | Freq: Four times a day (QID) | ORAL | 0 refills | Status: AC | PRN

## 2022-03-25 MED ORDER — DOCUSATE SODIUM 100 MG PO CAPS: 100.0000 mg | ORAL_CAPSULE | Freq: Two times a day (BID) | ORAL | 0 refills | Status: AC

## 2022-03-25 NOTE — Discharge Summary (Signed)
Obstetric Discharge Summary Reason for Admission: induction of labor Prenatal Procedures: none Intrapartum Procedures: cesarean: low cervical, transverse Postpartum Procedures: none Complications-Operative and Postpartum: none Hemoglobin  Date Value Ref Range Status  03/24/2022 11.1 (L) 12.0 - 15.0 g/dL Final   HCT  Date Value Ref Range Status  03/24/2022 31.9 (L) 36.0 - 46.0 % Final    Physical Exam:  General: alert, cooperative, and no distress Lochia: appropriate Uterine Fundus: firm Incision: healing well, no significant drainage, no dehiscence, no significant erythema DVT Evaluation: No evidence of DVT seen on physical exam.  Discharge Diagnoses: Term Pregnancy-delivered and Preelampsia  Discharge Information: Date: 03/25/2022 Activity: pelvic rest Diet: routine Medications: Ibuprofen, Colace, and labetalol 200mg  TID and procardia 60mg  XL BID Condition: stable Instructions: refer to practice specific booklet Discharge to: home  Follow-up Information     [facility]. Schedule an appointment as soon as possible for a visit in 1 week(s).   Specialty: Obstetrics and Gynecology Why: BP and incision check Contact information: [address] [place] [phone]                Newborn Data: Live born female  Birth Weight: 6 lb 4.2 oz (2840 g) APGAR: 4, 8  Newborn Delivery   Birth date/time: 03/22/2022 06:54:00 Delivery type: C-Section, Low Transverse C-section categorization: Primary    Delivery Summary for Cohen Macmurray  Labor Events:   Preterm labor: No data found  Rupture date: 03/22/2022  Rupture time: 6:53 AM  Rupture type: Artificial Intact  Fluid Color: Moderate Meconium  Induction: No data found  Augmentation: No data found  Complications: No data found  Cervical ripening: No data found No data found   No data found     Delivery:   Episiotomy: No data found   Lacerations: No data found  Repair suture: No data found  Repair # of packets: No data found  Blood loss (ml): No data found   Information for the patient's newborn:  Emaan, Gary Girl Lamya Delorise Shiner   Delivery 03/22/2022 6:54 AM by  C-Section, Low Transverse Sex:  female Gestational Age: [redacted]w[redacted]d Delivery Clinician:   Living?:         APGARS  One minute Five minutes Ten minutes  Skin color:        Heart rate:        Grimace:        Muscle tone:        Breathing:        Totals: 4  8      Presentation/position:      Resuscitation:   Cord information:    Disposition of cord blood:     Blood gases sent?  Complications:   Placenta: Delivered:       appearance Newborn Measurements: Weight: 6 lb 4.2 oz (2840 g)  Height: 19.75"  Head circumference:    Chest circumference:    Other providers:    Additional  information: Forceps:   Vacuum:   Breech:   Observed anomalies          Home with mother.  03/24/2022 03/25/2022, 1:21 PM

## 2022-03-25 NOTE — Plan of Care (Signed)
Pt. To be discharged home with printed instructions. .me

## 2022-04-01 ENCOUNTER — Telehealth (HOSPITAL_COMMUNITY): Payer: Self-pay | Admitting: *Deleted

## 2022-04-01 NOTE — Telephone Encounter (Signed)
Left phone voicemail message.  Duffy Rhody, RN 04-01-2022 at 12:15pm

## 2022-06-15 IMAGING — US US RENAL
1 series · 14 of 25 positions shown · non-contrast
Comparison: None.

CLINICAL DATA: Left flank pain

EXAM:
RENAL / URINARY TRACT ULTRASOUND COMPLETE

[Series 1: us renal · 0.20mm/px · 14 of 28 slices shown]
[im 1/28]
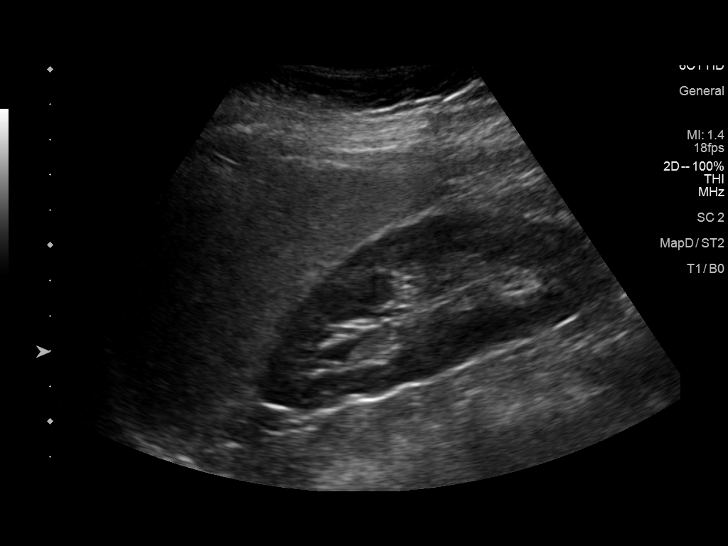
[im 3/28]
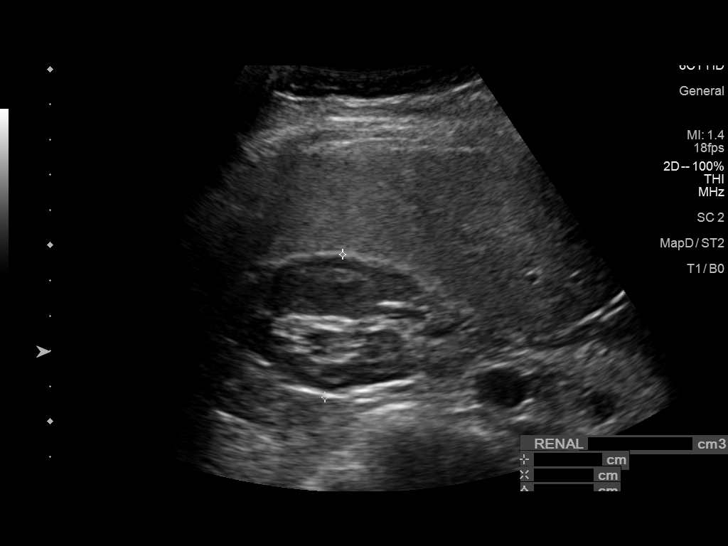
[im 5/28]
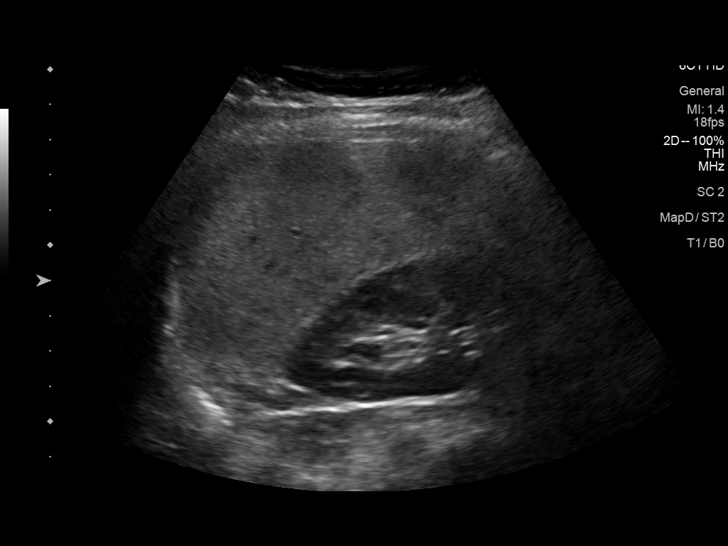
[im 7/28]
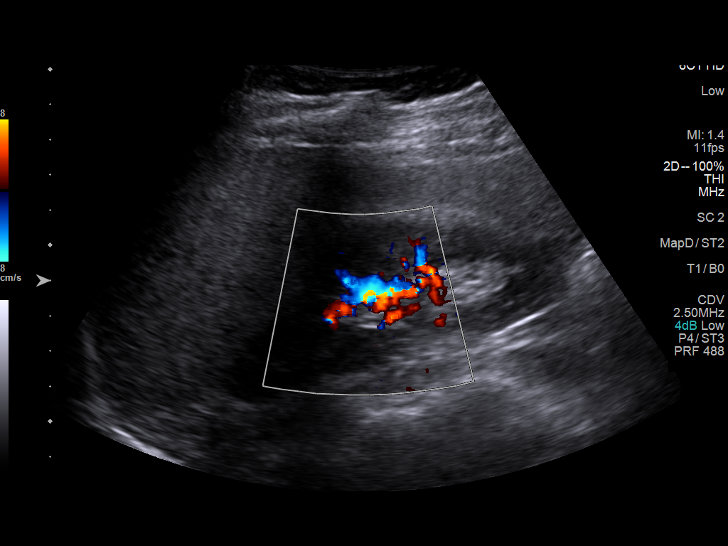
[im 10/28]
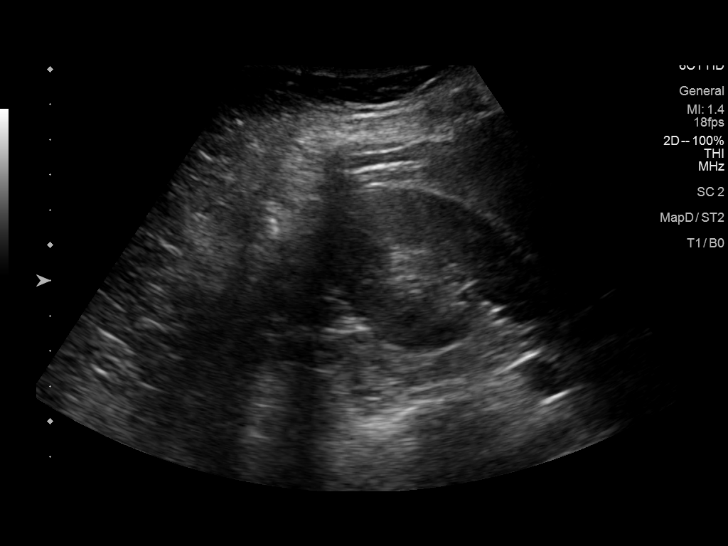
[im 11/28]
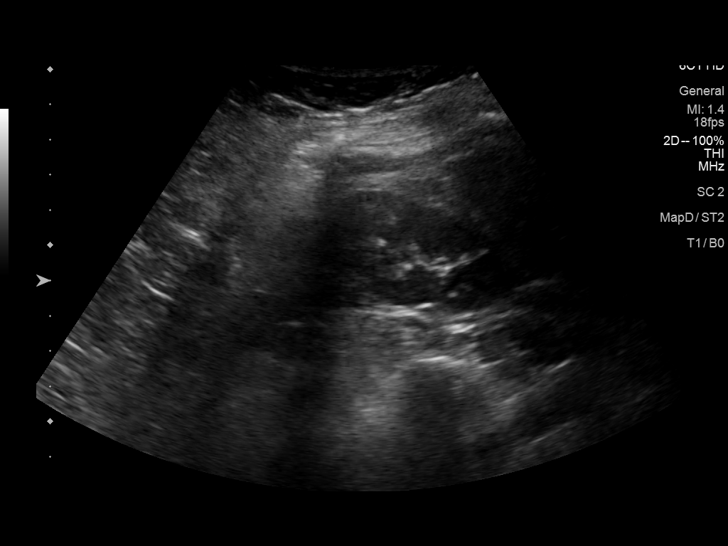
[im 13/28]
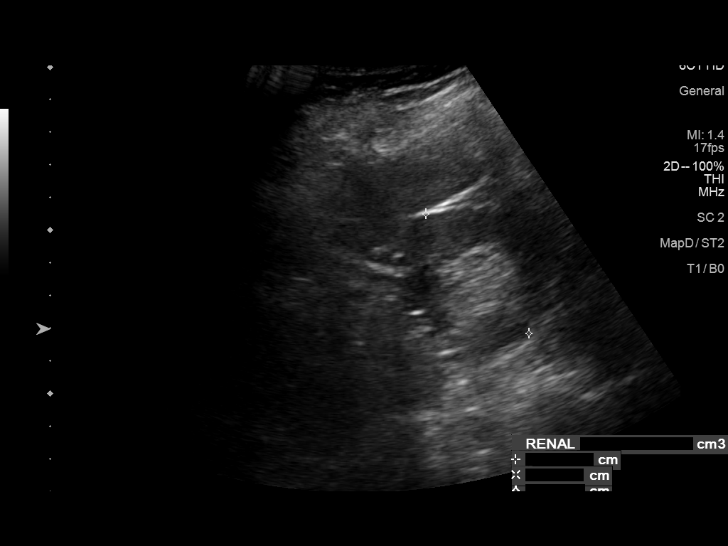
[im 15/28]
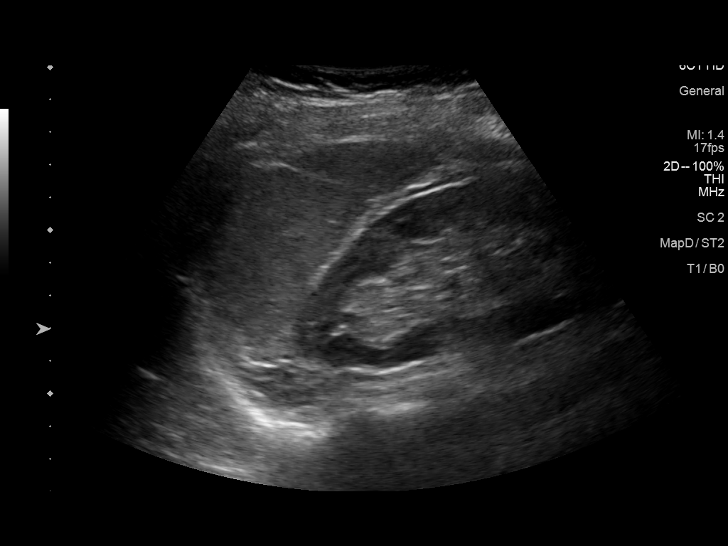
[im 17/28]
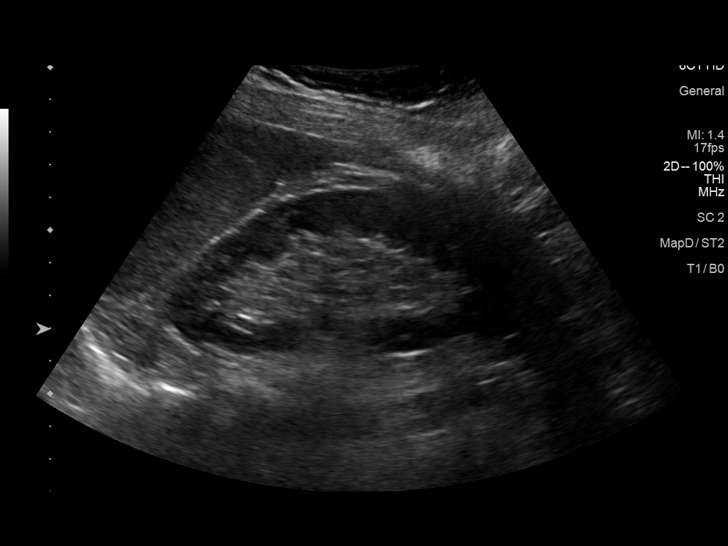
[im 19/28]
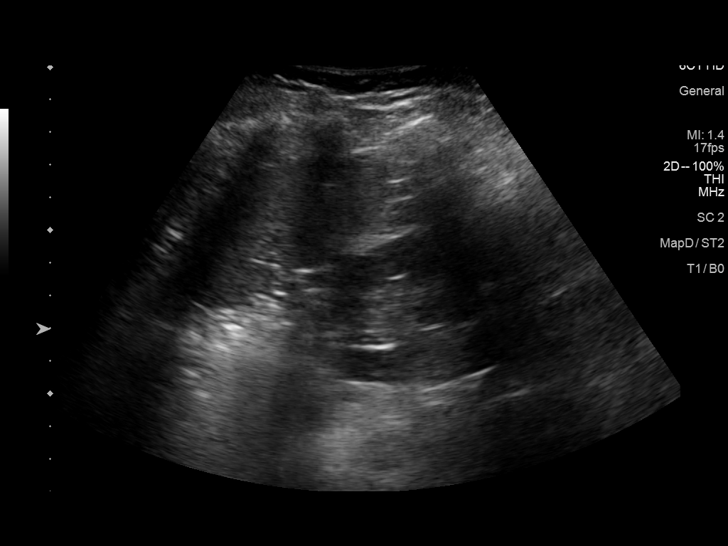
[im 21/28]
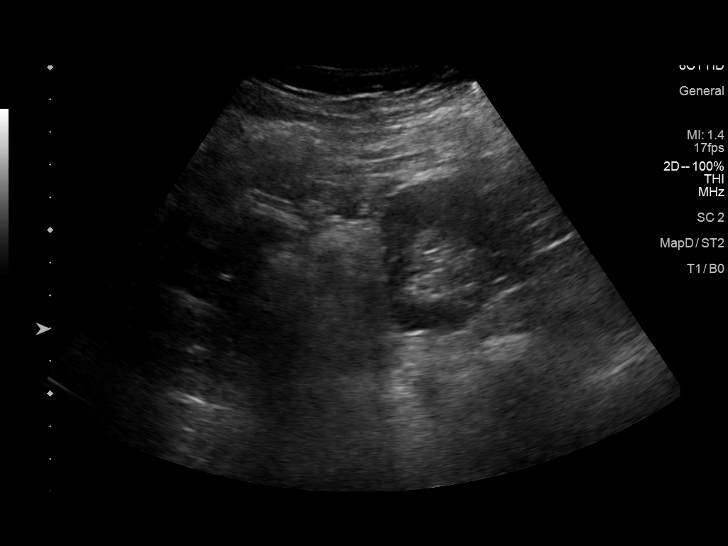
[im 23/28]
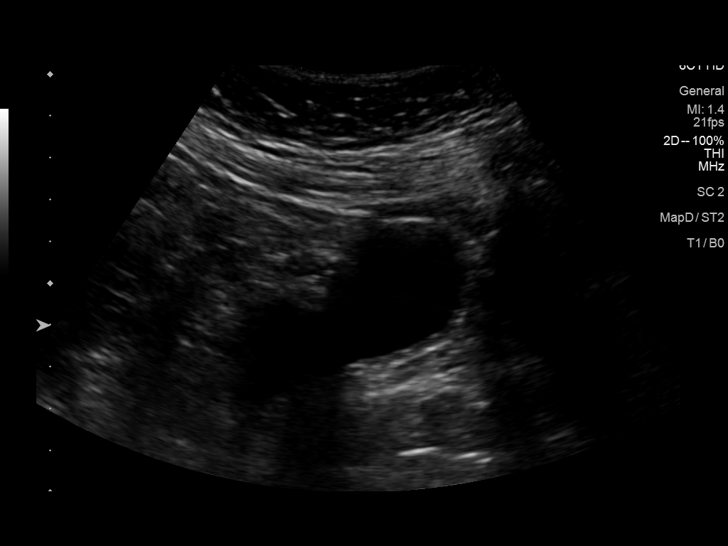
[im 25/28]
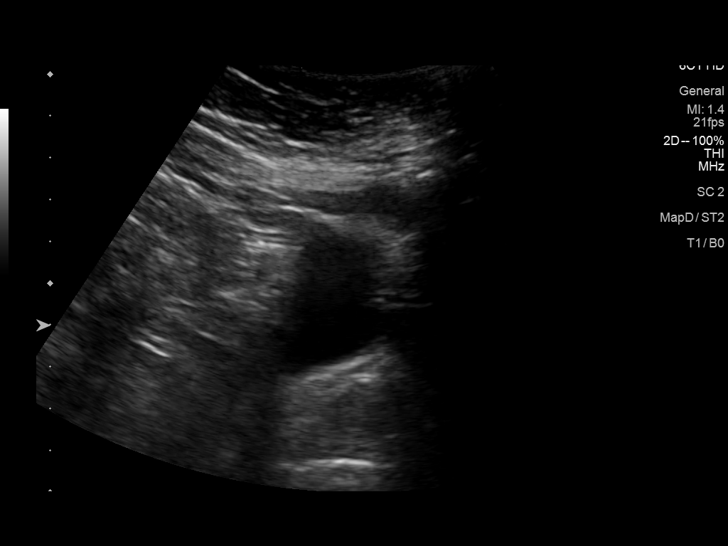
[im 28/28]
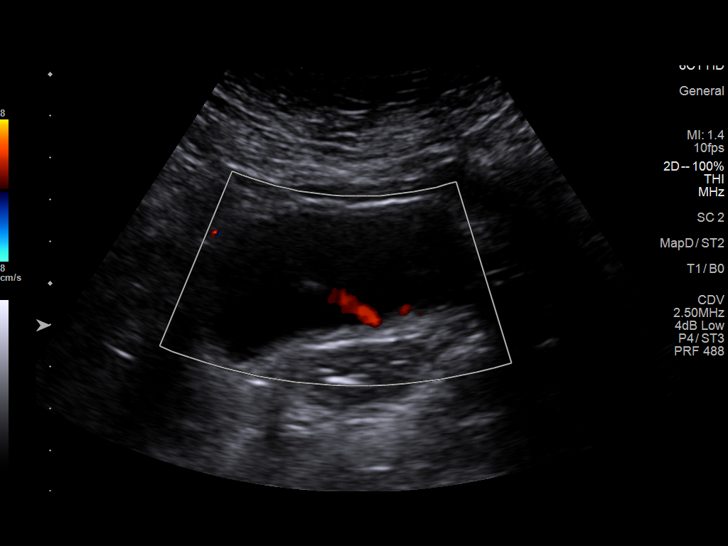

[14 of 25 positions shown; findings below may reference images not displayed]

FINDINGS: Right Kidney:

Renal measurements: 10.9 x 4.1 x 4.1 cm = volume: 96 mL.
Echogenicity within normal limits. No mass or hydronephrosis
visualized.

Left Kidney:

Renal measurements: 11.6 x 5 x 4.8 cm = volume: 148 mL. Echogenicity
within normal limits. No mass or hydronephrosis visualized.

Bladder:

Appears normal for degree of bladder distention.

Other:

Liver appears echogenic
IMPRESSION: 1. Normal ultrasound appearance of the kidneys
2. Echogenic appearing liver suggesting steatosis.

## 2023-01-18 DIAGNOSIS — Z0000 Encounter for general adult medical examination without abnormal findings: Secondary | ICD-10-CM | POA: Diagnosis not present

## 2023-01-20 DIAGNOSIS — E7800 Pure hypercholesterolemia, unspecified: Secondary | ICD-10-CM | POA: Diagnosis not present

## 2023-01-20 DIAGNOSIS — R61 Generalized hyperhidrosis: Secondary | ICD-10-CM | POA: Diagnosis not present

## 2023-07-04 DIAGNOSIS — L989 Disorder of the skin and subcutaneous tissue, unspecified: Secondary | ICD-10-CM | POA: Diagnosis not present

## 2023-07-04 DIAGNOSIS — F324 Major depressive disorder, single episode, in partial remission: Secondary | ICD-10-CM | POA: Diagnosis not present

## 2023-07-04 DIAGNOSIS — F411 Generalized anxiety disorder: Secondary | ICD-10-CM | POA: Diagnosis not present

## 2023-07-11 DIAGNOSIS — L821 Other seborrheic keratosis: Secondary | ICD-10-CM | POA: Diagnosis not present

## 2023-07-11 DIAGNOSIS — D225 Melanocytic nevi of trunk: Secondary | ICD-10-CM | POA: Diagnosis not present

## 2023-07-11 DIAGNOSIS — D485 Neoplasm of uncertain behavior of skin: Secondary | ICD-10-CM | POA: Diagnosis not present

## 2023-07-11 DIAGNOSIS — Z129 Encounter for screening for malignant neoplasm, site unspecified: Secondary | ICD-10-CM | POA: Diagnosis not present

## 2023-07-11 DIAGNOSIS — L820 Inflamed seborrheic keratosis: Secondary | ICD-10-CM | POA: Diagnosis not present

## 2023-08-09 DIAGNOSIS — Z6830 Body mass index (BMI) 30.0-30.9, adult: Secondary | ICD-10-CM | POA: Diagnosis not present

## 2023-08-09 DIAGNOSIS — F324 Major depressive disorder, single episode, in partial remission: Secondary | ICD-10-CM | POA: Diagnosis not present

## 2023-08-09 DIAGNOSIS — F411 Generalized anxiety disorder: Secondary | ICD-10-CM | POA: Diagnosis not present

## 2023-09-11 DIAGNOSIS — F324 Major depressive disorder, single episode, in partial remission: Secondary | ICD-10-CM | POA: Diagnosis not present

## 2023-09-11 DIAGNOSIS — F411 Generalized anxiety disorder: Secondary | ICD-10-CM | POA: Diagnosis not present

## 2023-10-26 DIAGNOSIS — F411 Generalized anxiety disorder: Secondary | ICD-10-CM | POA: Diagnosis not present

## 2023-10-26 DIAGNOSIS — F53 Postpartum depression: Secondary | ICD-10-CM | POA: Diagnosis not present

## 2023-11-23 DIAGNOSIS — F411 Generalized anxiety disorder: Secondary | ICD-10-CM | POA: Diagnosis not present

## 2023-11-23 DIAGNOSIS — F53 Postpartum depression: Secondary | ICD-10-CM | POA: Diagnosis not present

## 2024-01-31 DIAGNOSIS — I491 Atrial premature depolarization: Secondary | ICD-10-CM | POA: Diagnosis not present

## 2024-01-31 DIAGNOSIS — E669 Obesity, unspecified: Secondary | ICD-10-CM | POA: Diagnosis not present

## 2024-01-31 DIAGNOSIS — E559 Vitamin D deficiency, unspecified: Secondary | ICD-10-CM | POA: Diagnosis not present

## 2024-01-31 DIAGNOSIS — E7800 Pure hypercholesterolemia, unspecified: Secondary | ICD-10-CM | POA: Diagnosis not present

## 2024-01-31 DIAGNOSIS — Z0000 Encounter for general adult medical examination without abnormal findings: Secondary | ICD-10-CM | POA: Diagnosis not present

## 2024-01-31 DIAGNOSIS — Z6830 Body mass index (BMI) 30.0-30.9, adult: Secondary | ICD-10-CM | POA: Diagnosis not present
